# Patient Record
Sex: Male | Born: 1945 | Race: White | Hispanic: No | Marital: Married | Smoking: Never smoker
Health system: Southern US, Community
[De-identification: ages and names within clinical notes are randomized; demographics above are authoritative.]

## PROBLEM LIST (undated history)

## (undated) DIAGNOSIS — G4730 Sleep apnea, unspecified: Secondary | ICD-10-CM

## (undated) DIAGNOSIS — I2510 Atherosclerotic heart disease of native coronary artery without angina pectoris: Secondary | ICD-10-CM

## (undated) DIAGNOSIS — K5790 Diverticulosis of intestine, part unspecified, without perforation or abscess without bleeding: Secondary | ICD-10-CM

## (undated) DIAGNOSIS — F419 Anxiety disorder, unspecified: Secondary | ICD-10-CM

## (undated) DIAGNOSIS — M1990 Unspecified osteoarthritis, unspecified site: Secondary | ICD-10-CM

## (undated) DIAGNOSIS — M109 Gout, unspecified: Secondary | ICD-10-CM

## (undated) DIAGNOSIS — Z9289 Personal history of other medical treatment: Secondary | ICD-10-CM

## (undated) DIAGNOSIS — G245 Blepharospasm: Secondary | ICD-10-CM

## (undated) DIAGNOSIS — E785 Hyperlipidemia, unspecified: Secondary | ICD-10-CM

## (undated) DIAGNOSIS — I10 Essential (primary) hypertension: Secondary | ICD-10-CM

## (undated) HISTORY — PX: KNEE ARTHROSCOPY: SUR90

## (undated) HISTORY — DX: Essential (primary) hypertension: I10

## (undated) HISTORY — DX: Anxiety disorder, unspecified: F41.9

## (undated) HISTORY — PX: SHOULDER ARTHROSCOPY W/ ROTATOR CUFF REPAIR: SHX2400

## (undated) HISTORY — DX: Hyperlipidemia, unspecified: E78.5

## (undated) HISTORY — PX: BACK SURGERY: SHX140

## (undated) HISTORY — PX: CARDIAC CATHETERIZATION: SHX172

## (undated) HISTORY — PX: BROW LIFT AND BLEPHAROPLASTY: SHX1271

## (undated) HISTORY — DX: Atherosclerotic heart disease of native coronary artery without angina pectoris: I25.10

## (undated) HISTORY — DX: Personal history of other medical treatment: Z92.89

## (undated) HISTORY — DX: Gout, unspecified: M10.9

---

## 2000-04-22 ENCOUNTER — Ambulatory Visit (HOSPITAL_COMMUNITY): Admission: RE | Admit: 2000-04-22 | Discharge: 2000-04-22 | Payer: Self-pay | Admitting: Gastroenterology

## 2000-04-22 ENCOUNTER — Encounter: Payer: Self-pay | Admitting: Gastroenterology

## 2002-07-26 ENCOUNTER — Encounter: Payer: Self-pay | Admitting: *Deleted

## 2002-07-26 ENCOUNTER — Encounter: Admission: RE | Admit: 2002-07-26 | Discharge: 2002-07-26 | Payer: Self-pay | Admitting: *Deleted

## 2002-08-31 ENCOUNTER — Ambulatory Visit (HOSPITAL_COMMUNITY): Admission: RE | Admit: 2002-08-31 | Discharge: 2002-08-31 | Payer: Self-pay | Admitting: Gastroenterology

## 2002-11-01 ENCOUNTER — Encounter: Admission: RE | Admit: 2002-11-01 | Discharge: 2002-11-01 | Payer: Self-pay | Admitting: Internal Medicine

## 2002-11-01 ENCOUNTER — Encounter: Payer: Self-pay | Admitting: Internal Medicine

## 2002-12-06 ENCOUNTER — Encounter: Payer: Self-pay | Admitting: Internal Medicine

## 2002-12-06 ENCOUNTER — Encounter: Admission: RE | Admit: 2002-12-06 | Discharge: 2002-12-06 | Payer: Self-pay | Admitting: Internal Medicine

## 2002-12-23 ENCOUNTER — Encounter: Admission: RE | Admit: 2002-12-23 | Discharge: 2002-12-23 | Payer: Self-pay | Admitting: Internal Medicine

## 2002-12-23 ENCOUNTER — Encounter: Payer: Self-pay | Admitting: Internal Medicine

## 2008-05-15 ENCOUNTER — Encounter: Payer: Self-pay | Admitting: Interventional Cardiology

## 2008-05-23 ENCOUNTER — Encounter: Admission: RE | Admit: 2008-05-23 | Discharge: 2008-05-23 | Payer: Self-pay | Admitting: Interventional Cardiology

## 2008-05-29 ENCOUNTER — Inpatient Hospital Stay (HOSPITAL_BASED_OUTPATIENT_CLINIC_OR_DEPARTMENT_OTHER): Admission: RE | Admit: 2008-05-29 | Discharge: 2008-05-29 | Payer: Self-pay | Admitting: Interventional Cardiology

## 2008-12-25 ENCOUNTER — Encounter: Admission: RE | Admit: 2008-12-25 | Discharge: 2008-12-25 | Payer: Self-pay | Admitting: Gastroenterology

## 2009-03-19 ENCOUNTER — Encounter: Admission: RE | Admit: 2009-03-19 | Discharge: 2009-03-19 | Payer: Self-pay | Admitting: Family Medicine

## 2009-06-05 ENCOUNTER — Encounter: Admission: RE | Admit: 2009-06-05 | Discharge: 2009-06-05 | Payer: Self-pay | Admitting: Neurological Surgery

## 2009-09-08 ENCOUNTER — Encounter: Admission: RE | Admit: 2009-09-08 | Discharge: 2009-09-08 | Payer: Self-pay | Admitting: Orthopedic Surgery

## 2010-11-19 NOTE — Cardiovascular Report (Signed)
NAME:  John Novak, John Novak NO.:  [id number]   MEDICAL RECORD NO.:  [id number]          PATIENT TYPE:  OIB   LOCATION:  1961                         FACILITY:  [facility]   PHYSICIAN:  Lyn Records, M.D.   DATE OF BIRTH:  1946/03/09   DATE OF PROCEDURE:  05/29/2008  DATE OF DISCHARGE:                            CARDIAC CATHETERIZATION   INDICATIONS:  [name] has risk factors for coronary disease.  He has  had atypical chest discomfort.  He recently underwent a Cardiolite study  that demonstrated possible lateral wall ischemia and fixed inferior wall  defect.  This study is being done to define coronary anatomy and help  guide therapy.   PROCEDURES PERFORMED:  1. Left heart catheterization.  2. Selective coronary angiogram.  3. Left ventriculography.   DESCRIPTION OF PROCEDURE:  A 4-French sheath was inserted into the right  femoral artery using a modified Seldinger technique.  A 4-French A2  multipurpose catheter was used for hemodynamic recordings, left  ventriculography by hand injection, and right coronary angiography.  A  #4 left Judkins catheter was used for left coronary angiography.   The patient received 1% Xylocaine infiltration for local anesthesia and  was given mild sedation with Versed and fentanyl 1 mg and 25 mcg  respectively prior to administering Xylocaine.  The patient tolerated  the procedure without complications and hemostasis was achieved with  manual compression.   RESULTS:  1. Hemodynamic data:      a.     Aortic pressure 150/83 mmHg.      b.     Left ventricular pressure 156/25 mmHg.  2. Left ventriculography:  The left ventricular cavity size and      function are normal.  The EF is 60%.  3. Coronary angiography:      a.     Left main coronary:  Left main is calcified.  There is 30%       distal tubular narrowing within the calcified segment.      b.     Left anterior descending coronary:  The LAD is large.  It       contains proximal  calcification.  It is widely patent.       Irregularities with up to 30% narrowing are noted.  LAD wraps       around the left ventricular apex.  Irregularities are noted in the       midvessel.      c.     Circumflex artery:  The circumflex coronary artery is       calcified.  Gives origin to a large first obtuse marginal and a       large second obtuse marginal.  Irregularities with up to 30-40%       narrowing are noted.  No high-grade obstruction is seen.      d.     Right coronary:  This vessel is also heavily calcified.  It       gives origin to a small PDA and a larger left ventricular branch.       A smaller second left  ventricular branch was also noted just off       the atrioventricular groove on the mid lateral wall.  The proximal       and mid calcification also represents 20-30% narrowing within the       proximal and mid right coronary.   CONCLUSIONS:  1. Atherosclerosis with nonobstructive coronaries.  There is      calcification and plaque involving the distal left main, the      proximal and mid left anterior descending, the mid circumflex, and      the proximal and mid right coronary artery.  No significant      obstructions are noted.  The vessels are widely patent.  2. Normal left ventricular function.  3. False positive nuclear study.   PLAN:  Aggressive risk factor modification and antiplatelet therapy  going forward.  I would treat the patient aggressively to get his LDL  cholesterol less than 70.      Lyn Records, M.D.  Electronically Signed     HWS/MEDQ  D:  05/29/2008  T:  05/29/2008  Job:  [id number]   cc:   Pam Drown, M.D.

## 2011-07-09 DIAGNOSIS — M5137 Other intervertebral disc degeneration, lumbosacral region: Secondary | ICD-10-CM | POA: Diagnosis not present

## 2011-07-09 DIAGNOSIS — M5030 Other cervical disc degeneration, unspecified cervical region: Secondary | ICD-10-CM | POA: Diagnosis not present

## 2011-07-09 DIAGNOSIS — M9981 Other biomechanical lesions of cervical region: Secondary | ICD-10-CM | POA: Diagnosis not present

## 2011-07-09 DIAGNOSIS — M999 Biomechanical lesion, unspecified: Secondary | ICD-10-CM | POA: Diagnosis not present

## 2011-07-10 DIAGNOSIS — J309 Allergic rhinitis, unspecified: Secondary | ICD-10-CM | POA: Diagnosis not present

## 2011-07-15 DIAGNOSIS — J309 Allergic rhinitis, unspecified: Secondary | ICD-10-CM | POA: Diagnosis not present

## 2011-07-15 DIAGNOSIS — M999 Biomechanical lesion, unspecified: Secondary | ICD-10-CM | POA: Diagnosis not present

## 2011-07-15 DIAGNOSIS — M9981 Other biomechanical lesions of cervical region: Secondary | ICD-10-CM | POA: Diagnosis not present

## 2011-07-15 DIAGNOSIS — M5030 Other cervical disc degeneration, unspecified cervical region: Secondary | ICD-10-CM | POA: Diagnosis not present

## 2011-07-15 DIAGNOSIS — M5137 Other intervertebral disc degeneration, lumbosacral region: Secondary | ICD-10-CM | POA: Diagnosis not present

## 2011-07-22 DIAGNOSIS — M5030 Other cervical disc degeneration, unspecified cervical region: Secondary | ICD-10-CM | POA: Diagnosis not present

## 2011-07-22 DIAGNOSIS — M5137 Other intervertebral disc degeneration, lumbosacral region: Secondary | ICD-10-CM | POA: Diagnosis not present

## 2011-07-22 DIAGNOSIS — M999 Biomechanical lesion, unspecified: Secondary | ICD-10-CM | POA: Diagnosis not present

## 2011-07-22 DIAGNOSIS — J309 Allergic rhinitis, unspecified: Secondary | ICD-10-CM | POA: Diagnosis not present

## 2011-07-22 DIAGNOSIS — M9981 Other biomechanical lesions of cervical region: Secondary | ICD-10-CM | POA: Diagnosis not present

## 2011-07-23 ENCOUNTER — Other Ambulatory Visit: Payer: Self-pay | Admitting: Gastroenterology

## 2011-07-23 DIAGNOSIS — R143 Flatulence: Secondary | ICD-10-CM | POA: Diagnosis not present

## 2011-07-23 DIAGNOSIS — K219 Gastro-esophageal reflux disease without esophagitis: Secondary | ICD-10-CM | POA: Diagnosis not present

## 2011-07-23 DIAGNOSIS — R1011 Right upper quadrant pain: Secondary | ICD-10-CM | POA: Diagnosis not present

## 2011-07-24 ENCOUNTER — Other Ambulatory Visit: Payer: Self-pay

## 2011-07-24 DIAGNOSIS — G518 Other disorders of facial nerve: Secondary | ICD-10-CM | POA: Diagnosis not present

## 2011-07-24 DIAGNOSIS — Z09 Encounter for follow-up examination after completed treatment for conditions other than malignant neoplasm: Secondary | ICD-10-CM | POA: Diagnosis not present

## 2011-07-24 DIAGNOSIS — G243 Spasmodic torticollis: Secondary | ICD-10-CM | POA: Diagnosis not present

## 2011-07-25 ENCOUNTER — Ambulatory Visit
Admission: RE | Admit: 2011-07-25 | Discharge: 2011-07-25 | Disposition: A | Discharge status: Home / Self Care | Payer: Medicare Other | Source: Ambulatory Visit | Attending: Gastroenterology | Admitting: Gastroenterology

## 2011-07-25 DIAGNOSIS — R110 Nausea: Secondary | ICD-10-CM | POA: Diagnosis not present

## 2011-07-25 DIAGNOSIS — I2510 Atherosclerotic heart disease of native coronary artery without angina pectoris: Secondary | ICD-10-CM | POA: Diagnosis not present

## 2011-07-25 DIAGNOSIS — R109 Unspecified abdominal pain: Secondary | ICD-10-CM | POA: Diagnosis not present

## 2011-07-25 MED ORDER — IOHEXOL 300 MG/ML  SOLN
125.0000 mL | Freq: Once | INTRAMUSCULAR | Status: AC | PRN
  Administered 2011-07-25: 125 mL via INTRAVENOUS

## 2011-07-30 ENCOUNTER — Other Ambulatory Visit (HOSPITAL_COMMUNITY): Payer: Self-pay | Admitting: Gastroenterology

## 2011-07-30 DIAGNOSIS — R140 Abdominal distension (gaseous): Secondary | ICD-10-CM

## 2011-07-31 DIAGNOSIS — J309 Allergic rhinitis, unspecified: Secondary | ICD-10-CM | POA: Diagnosis not present

## 2011-08-01 DIAGNOSIS — M5030 Other cervical disc degeneration, unspecified cervical region: Secondary | ICD-10-CM | POA: Diagnosis not present

## 2011-08-01 DIAGNOSIS — M5137 Other intervertebral disc degeneration, lumbosacral region: Secondary | ICD-10-CM | POA: Diagnosis not present

## 2011-08-01 DIAGNOSIS — M9981 Other biomechanical lesions of cervical region: Secondary | ICD-10-CM | POA: Diagnosis not present

## 2011-08-01 DIAGNOSIS — M999 Biomechanical lesion, unspecified: Secondary | ICD-10-CM | POA: Diagnosis not present

## 2011-08-04 DIAGNOSIS — G245 Blepharospasm: Secondary | ICD-10-CM | POA: Diagnosis not present

## 2011-08-05 DIAGNOSIS — M999 Biomechanical lesion, unspecified: Secondary | ICD-10-CM | POA: Diagnosis not present

## 2011-08-05 DIAGNOSIS — M5030 Other cervical disc degeneration, unspecified cervical region: Secondary | ICD-10-CM | POA: Diagnosis not present

## 2011-08-05 DIAGNOSIS — J309 Allergic rhinitis, unspecified: Secondary | ICD-10-CM | POA: Diagnosis not present

## 2011-08-05 DIAGNOSIS — M9981 Other biomechanical lesions of cervical region: Secondary | ICD-10-CM | POA: Diagnosis not present

## 2011-08-05 DIAGNOSIS — M5137 Other intervertebral disc degeneration, lumbosacral region: Secondary | ICD-10-CM | POA: Diagnosis not present

## 2011-08-08 ENCOUNTER — Encounter (HOSPITAL_COMMUNITY)
Admission: RE | Admit: 2011-08-08 | Discharge: 2011-08-08 | Disposition: A | Discharge status: Home / Self Care | Payer: Medicare Other | Source: Ambulatory Visit | Attending: Gastroenterology | Admitting: Gastroenterology

## 2011-08-08 DIAGNOSIS — R112 Nausea with vomiting, unspecified: Secondary | ICD-10-CM | POA: Diagnosis not present

## 2011-08-08 DIAGNOSIS — R109 Unspecified abdominal pain: Secondary | ICD-10-CM | POA: Diagnosis not present

## 2011-08-08 DIAGNOSIS — R1013 Epigastric pain: Secondary | ICD-10-CM | POA: Diagnosis not present

## 2011-08-08 DIAGNOSIS — K3189 Other diseases of stomach and duodenum: Secondary | ICD-10-CM | POA: Diagnosis not present

## 2011-08-08 DIAGNOSIS — R143 Flatulence: Secondary | ICD-10-CM | POA: Insufficient documentation

## 2011-08-08 DIAGNOSIS — R141 Gas pain: Secondary | ICD-10-CM | POA: Diagnosis not present

## 2011-08-08 DIAGNOSIS — R140 Abdominal distension (gaseous): Secondary | ICD-10-CM

## 2011-08-08 DIAGNOSIS — R142 Eructation: Secondary | ICD-10-CM | POA: Insufficient documentation

## 2011-08-08 MED ORDER — TECHNETIUM TC 99M SULFUR COLLOID
1.9000 | Freq: Once | INTRAVENOUS | Status: AC | PRN
  Administered 2011-08-08: 1.9 via ORAL

## 2011-08-12 DIAGNOSIS — K3184 Gastroparesis: Secondary | ICD-10-CM | POA: Diagnosis not present

## 2011-08-12 DIAGNOSIS — Z86010 Personal history of colonic polyps: Secondary | ICD-10-CM | POA: Diagnosis not present

## 2011-08-12 DIAGNOSIS — M5137 Other intervertebral disc degeneration, lumbosacral region: Secondary | ICD-10-CM | POA: Diagnosis not present

## 2011-08-12 DIAGNOSIS — M5030 Other cervical disc degeneration, unspecified cervical region: Secondary | ICD-10-CM | POA: Diagnosis not present

## 2011-08-12 DIAGNOSIS — M9981 Other biomechanical lesions of cervical region: Secondary | ICD-10-CM | POA: Diagnosis not present

## 2011-08-12 DIAGNOSIS — J309 Allergic rhinitis, unspecified: Secondary | ICD-10-CM | POA: Diagnosis not present

## 2011-08-12 DIAGNOSIS — M999 Biomechanical lesion, unspecified: Secondary | ICD-10-CM | POA: Diagnosis not present

## 2011-08-19 DIAGNOSIS — J309 Allergic rhinitis, unspecified: Secondary | ICD-10-CM | POA: Diagnosis not present

## 2011-08-19 DIAGNOSIS — M5030 Other cervical disc degeneration, unspecified cervical region: Secondary | ICD-10-CM | POA: Diagnosis not present

## 2011-08-19 DIAGNOSIS — M9981 Other biomechanical lesions of cervical region: Secondary | ICD-10-CM | POA: Diagnosis not present

## 2011-08-19 DIAGNOSIS — M5137 Other intervertebral disc degeneration, lumbosacral region: Secondary | ICD-10-CM | POA: Diagnosis not present

## 2011-08-19 DIAGNOSIS — M999 Biomechanical lesion, unspecified: Secondary | ICD-10-CM | POA: Diagnosis not present

## 2011-08-26 DIAGNOSIS — M999 Biomechanical lesion, unspecified: Secondary | ICD-10-CM | POA: Diagnosis not present

## 2011-08-26 DIAGNOSIS — M9981 Other biomechanical lesions of cervical region: Secondary | ICD-10-CM | POA: Diagnosis not present

## 2011-08-26 DIAGNOSIS — M5137 Other intervertebral disc degeneration, lumbosacral region: Secondary | ICD-10-CM | POA: Diagnosis not present

## 2011-08-26 DIAGNOSIS — J309 Allergic rhinitis, unspecified: Secondary | ICD-10-CM | POA: Diagnosis not present

## 2011-08-26 DIAGNOSIS — M5030 Other cervical disc degeneration, unspecified cervical region: Secondary | ICD-10-CM | POA: Diagnosis not present

## 2011-08-27 DIAGNOSIS — I2510 Atherosclerotic heart disease of native coronary artery without angina pectoris: Secondary | ICD-10-CM | POA: Diagnosis not present

## 2011-08-27 DIAGNOSIS — M109 Gout, unspecified: Secondary | ICD-10-CM | POA: Diagnosis not present

## 2011-08-27 DIAGNOSIS — I10 Essential (primary) hypertension: Secondary | ICD-10-CM | POA: Diagnosis not present

## 2011-08-27 DIAGNOSIS — Z791 Long term (current) use of non-steroidal anti-inflammatories (NSAID): Secondary | ICD-10-CM | POA: Diagnosis not present

## 2011-08-27 DIAGNOSIS — G479 Sleep disorder, unspecified: Secondary | ICD-10-CM | POA: Diagnosis not present

## 2011-08-27 DIAGNOSIS — K219 Gastro-esophageal reflux disease without esophagitis: Secondary | ICD-10-CM | POA: Diagnosis not present

## 2011-08-27 DIAGNOSIS — E782 Mixed hyperlipidemia: Secondary | ICD-10-CM | POA: Diagnosis not present

## 2011-08-27 DIAGNOSIS — G244 Idiopathic orofacial dystonia: Secondary | ICD-10-CM | POA: Diagnosis not present

## 2011-09-02 DIAGNOSIS — M5030 Other cervical disc degeneration, unspecified cervical region: Secondary | ICD-10-CM | POA: Diagnosis not present

## 2011-09-02 DIAGNOSIS — J309 Allergic rhinitis, unspecified: Secondary | ICD-10-CM | POA: Diagnosis not present

## 2011-09-02 DIAGNOSIS — M5137 Other intervertebral disc degeneration, lumbosacral region: Secondary | ICD-10-CM | POA: Diagnosis not present

## 2011-09-02 DIAGNOSIS — M999 Biomechanical lesion, unspecified: Secondary | ICD-10-CM | POA: Diagnosis not present

## 2011-09-02 DIAGNOSIS — M9981 Other biomechanical lesions of cervical region: Secondary | ICD-10-CM | POA: Diagnosis not present

## 2011-09-09 DIAGNOSIS — M5137 Other intervertebral disc degeneration, lumbosacral region: Secondary | ICD-10-CM | POA: Diagnosis not present

## 2011-09-09 DIAGNOSIS — M999 Biomechanical lesion, unspecified: Secondary | ICD-10-CM | POA: Diagnosis not present

## 2011-09-09 DIAGNOSIS — M9981 Other biomechanical lesions of cervical region: Secondary | ICD-10-CM | POA: Diagnosis not present

## 2011-09-09 DIAGNOSIS — J309 Allergic rhinitis, unspecified: Secondary | ICD-10-CM | POA: Diagnosis not present

## 2011-09-09 DIAGNOSIS — M5030 Other cervical disc degeneration, unspecified cervical region: Secondary | ICD-10-CM | POA: Diagnosis not present

## 2011-09-16 DIAGNOSIS — M9981 Other biomechanical lesions of cervical region: Secondary | ICD-10-CM | POA: Diagnosis not present

## 2011-09-16 DIAGNOSIS — M5137 Other intervertebral disc degeneration, lumbosacral region: Secondary | ICD-10-CM | POA: Diagnosis not present

## 2011-09-16 DIAGNOSIS — M999 Biomechanical lesion, unspecified: Secondary | ICD-10-CM | POA: Diagnosis not present

## 2011-09-16 DIAGNOSIS — J309 Allergic rhinitis, unspecified: Secondary | ICD-10-CM | POA: Diagnosis not present

## 2011-09-16 DIAGNOSIS — M5030 Other cervical disc degeneration, unspecified cervical region: Secondary | ICD-10-CM | POA: Diagnosis not present

## 2011-09-23 DIAGNOSIS — M999 Biomechanical lesion, unspecified: Secondary | ICD-10-CM | POA: Diagnosis not present

## 2011-09-23 DIAGNOSIS — M9981 Other biomechanical lesions of cervical region: Secondary | ICD-10-CM | POA: Diagnosis not present

## 2011-09-23 DIAGNOSIS — M5137 Other intervertebral disc degeneration, lumbosacral region: Secondary | ICD-10-CM | POA: Diagnosis not present

## 2011-09-23 DIAGNOSIS — M5030 Other cervical disc degeneration, unspecified cervical region: Secondary | ICD-10-CM | POA: Diagnosis not present

## 2011-09-23 DIAGNOSIS — J309 Allergic rhinitis, unspecified: Secondary | ICD-10-CM | POA: Diagnosis not present

## 2011-09-29 DIAGNOSIS — G245 Blepharospasm: Secondary | ICD-10-CM | POA: Diagnosis not present

## 2011-09-30 DIAGNOSIS — J309 Allergic rhinitis, unspecified: Secondary | ICD-10-CM | POA: Diagnosis not present

## 2011-10-07 DIAGNOSIS — J309 Allergic rhinitis, unspecified: Secondary | ICD-10-CM | POA: Diagnosis not present

## 2011-10-14 DIAGNOSIS — J309 Allergic rhinitis, unspecified: Secondary | ICD-10-CM | POA: Diagnosis not present

## 2011-10-21 DIAGNOSIS — J309 Allergic rhinitis, unspecified: Secondary | ICD-10-CM | POA: Diagnosis not present

## 2011-10-28 DIAGNOSIS — J309 Allergic rhinitis, unspecified: Secondary | ICD-10-CM | POA: Diagnosis not present

## 2011-11-04 DIAGNOSIS — J309 Allergic rhinitis, unspecified: Secondary | ICD-10-CM | POA: Diagnosis not present

## 2011-11-06 DIAGNOSIS — J309 Allergic rhinitis, unspecified: Secondary | ICD-10-CM | POA: Diagnosis not present

## 2011-11-11 DIAGNOSIS — J309 Allergic rhinitis, unspecified: Secondary | ICD-10-CM | POA: Diagnosis not present

## 2011-11-18 DIAGNOSIS — J309 Allergic rhinitis, unspecified: Secondary | ICD-10-CM | POA: Diagnosis not present

## 2011-11-19 DIAGNOSIS — I10 Essential (primary) hypertension: Secondary | ICD-10-CM | POA: Diagnosis not present

## 2011-11-19 DIAGNOSIS — I2510 Atherosclerotic heart disease of native coronary artery without angina pectoris: Secondary | ICD-10-CM | POA: Diagnosis not present

## 2011-11-25 DIAGNOSIS — J309 Allergic rhinitis, unspecified: Secondary | ICD-10-CM | POA: Diagnosis not present

## 2011-12-02 DIAGNOSIS — J309 Allergic rhinitis, unspecified: Secondary | ICD-10-CM | POA: Diagnosis not present

## 2011-12-09 DIAGNOSIS — J309 Allergic rhinitis, unspecified: Secondary | ICD-10-CM | POA: Diagnosis not present

## 2011-12-16 DIAGNOSIS — M9981 Other biomechanical lesions of cervical region: Secondary | ICD-10-CM | POA: Diagnosis not present

## 2011-12-16 DIAGNOSIS — M999 Biomechanical lesion, unspecified: Secondary | ICD-10-CM | POA: Diagnosis not present

## 2011-12-16 DIAGNOSIS — M5137 Other intervertebral disc degeneration, lumbosacral region: Secondary | ICD-10-CM | POA: Diagnosis not present

## 2011-12-16 DIAGNOSIS — J309 Allergic rhinitis, unspecified: Secondary | ICD-10-CM | POA: Diagnosis not present

## 2011-12-16 DIAGNOSIS — M5030 Other cervical disc degeneration, unspecified cervical region: Secondary | ICD-10-CM | POA: Diagnosis not present

## 2011-12-23 DIAGNOSIS — M9981 Other biomechanical lesions of cervical region: Secondary | ICD-10-CM | POA: Diagnosis not present

## 2011-12-23 DIAGNOSIS — J309 Allergic rhinitis, unspecified: Secondary | ICD-10-CM | POA: Diagnosis not present

## 2011-12-23 DIAGNOSIS — M999 Biomechanical lesion, unspecified: Secondary | ICD-10-CM | POA: Diagnosis not present

## 2011-12-23 DIAGNOSIS — M5030 Other cervical disc degeneration, unspecified cervical region: Secondary | ICD-10-CM | POA: Diagnosis not present

## 2011-12-23 DIAGNOSIS — M5137 Other intervertebral disc degeneration, lumbosacral region: Secondary | ICD-10-CM | POA: Diagnosis not present

## 2011-12-25 DIAGNOSIS — G244 Idiopathic orofacial dystonia: Secondary | ICD-10-CM | POA: Diagnosis not present

## 2011-12-29 DIAGNOSIS — G245 Blepharospasm: Secondary | ICD-10-CM | POA: Diagnosis not present

## 2011-12-30 DIAGNOSIS — M5030 Other cervical disc degeneration, unspecified cervical region: Secondary | ICD-10-CM | POA: Diagnosis not present

## 2011-12-30 DIAGNOSIS — M5137 Other intervertebral disc degeneration, lumbosacral region: Secondary | ICD-10-CM | POA: Diagnosis not present

## 2011-12-30 DIAGNOSIS — M999 Biomechanical lesion, unspecified: Secondary | ICD-10-CM | POA: Diagnosis not present

## 2011-12-30 DIAGNOSIS — M9981 Other biomechanical lesions of cervical region: Secondary | ICD-10-CM | POA: Diagnosis not present

## 2011-12-30 DIAGNOSIS — J309 Allergic rhinitis, unspecified: Secondary | ICD-10-CM | POA: Diagnosis not present

## 2012-01-06 DIAGNOSIS — M5137 Other intervertebral disc degeneration, lumbosacral region: Secondary | ICD-10-CM | POA: Diagnosis not present

## 2012-01-06 DIAGNOSIS — M999 Biomechanical lesion, unspecified: Secondary | ICD-10-CM | POA: Diagnosis not present

## 2012-01-06 DIAGNOSIS — J309 Allergic rhinitis, unspecified: Secondary | ICD-10-CM | POA: Diagnosis not present

## 2012-01-06 DIAGNOSIS — M5030 Other cervical disc degeneration, unspecified cervical region: Secondary | ICD-10-CM | POA: Diagnosis not present

## 2012-01-06 DIAGNOSIS — M9981 Other biomechanical lesions of cervical region: Secondary | ICD-10-CM | POA: Diagnosis not present

## 2012-01-13 DIAGNOSIS — M9981 Other biomechanical lesions of cervical region: Secondary | ICD-10-CM | POA: Diagnosis not present

## 2012-01-13 DIAGNOSIS — M5030 Other cervical disc degeneration, unspecified cervical region: Secondary | ICD-10-CM | POA: Diagnosis not present

## 2012-01-13 DIAGNOSIS — M5137 Other intervertebral disc degeneration, lumbosacral region: Secondary | ICD-10-CM | POA: Diagnosis not present

## 2012-01-13 DIAGNOSIS — J309 Allergic rhinitis, unspecified: Secondary | ICD-10-CM | POA: Diagnosis not present

## 2012-01-13 DIAGNOSIS — M999 Biomechanical lesion, unspecified: Secondary | ICD-10-CM | POA: Diagnosis not present

## 2012-01-20 DIAGNOSIS — J301 Allergic rhinitis due to pollen: Secondary | ICD-10-CM | POA: Diagnosis not present

## 2012-01-20 DIAGNOSIS — M5030 Other cervical disc degeneration, unspecified cervical region: Secondary | ICD-10-CM | POA: Diagnosis not present

## 2012-01-20 DIAGNOSIS — M5137 Other intervertebral disc degeneration, lumbosacral region: Secondary | ICD-10-CM | POA: Diagnosis not present

## 2012-01-20 DIAGNOSIS — J3089 Other allergic rhinitis: Secondary | ICD-10-CM | POA: Diagnosis not present

## 2012-01-20 DIAGNOSIS — J309 Allergic rhinitis, unspecified: Secondary | ICD-10-CM | POA: Diagnosis not present

## 2012-01-20 DIAGNOSIS — H1045 Other chronic allergic conjunctivitis: Secondary | ICD-10-CM | POA: Diagnosis not present

## 2012-01-20 DIAGNOSIS — M999 Biomechanical lesion, unspecified: Secondary | ICD-10-CM | POA: Diagnosis not present

## 2012-01-20 DIAGNOSIS — M9981 Other biomechanical lesions of cervical region: Secondary | ICD-10-CM | POA: Diagnosis not present

## 2012-01-27 DIAGNOSIS — J309 Allergic rhinitis, unspecified: Secondary | ICD-10-CM | POA: Diagnosis not present

## 2012-02-03 DIAGNOSIS — J309 Allergic rhinitis, unspecified: Secondary | ICD-10-CM | POA: Diagnosis not present

## 2012-02-10 DIAGNOSIS — M999 Biomechanical lesion, unspecified: Secondary | ICD-10-CM | POA: Diagnosis not present

## 2012-02-10 DIAGNOSIS — J309 Allergic rhinitis, unspecified: Secondary | ICD-10-CM | POA: Diagnosis not present

## 2012-02-10 DIAGNOSIS — M9981 Other biomechanical lesions of cervical region: Secondary | ICD-10-CM | POA: Diagnosis not present

## 2012-02-10 DIAGNOSIS — M5030 Other cervical disc degeneration, unspecified cervical region: Secondary | ICD-10-CM | POA: Diagnosis not present

## 2012-02-10 DIAGNOSIS — M5137 Other intervertebral disc degeneration, lumbosacral region: Secondary | ICD-10-CM | POA: Diagnosis not present

## 2012-02-24 DIAGNOSIS — J309 Allergic rhinitis, unspecified: Secondary | ICD-10-CM | POA: Diagnosis not present

## 2012-02-27 DIAGNOSIS — H2510 Age-related nuclear cataract, unspecified eye: Secondary | ICD-10-CM | POA: Diagnosis not present

## 2012-02-27 DIAGNOSIS — H04129 Dry eye syndrome of unspecified lacrimal gland: Secondary | ICD-10-CM | POA: Diagnosis not present

## 2012-02-27 DIAGNOSIS — H25019 Cortical age-related cataract, unspecified eye: Secondary | ICD-10-CM | POA: Diagnosis not present

## 2012-03-01 DIAGNOSIS — K219 Gastro-esophageal reflux disease without esophagitis: Secondary | ICD-10-CM | POA: Diagnosis not present

## 2012-03-01 DIAGNOSIS — G244 Idiopathic orofacial dystonia: Secondary | ICD-10-CM | POA: Diagnosis not present

## 2012-03-01 DIAGNOSIS — G479 Sleep disorder, unspecified: Secondary | ICD-10-CM | POA: Diagnosis not present

## 2012-03-01 DIAGNOSIS — I2510 Atherosclerotic heart disease of native coronary artery without angina pectoris: Secondary | ICD-10-CM | POA: Diagnosis not present

## 2012-03-01 DIAGNOSIS — I10 Essential (primary) hypertension: Secondary | ICD-10-CM | POA: Diagnosis not present

## 2012-03-01 DIAGNOSIS — IMO0002 Reserved for concepts with insufficient information to code with codable children: Secondary | ICD-10-CM | POA: Diagnosis not present

## 2012-03-01 DIAGNOSIS — E782 Mixed hyperlipidemia: Secondary | ICD-10-CM | POA: Diagnosis not present

## 2012-03-01 DIAGNOSIS — M109 Gout, unspecified: Secondary | ICD-10-CM | POA: Diagnosis not present

## 2012-03-02 DIAGNOSIS — J309 Allergic rhinitis, unspecified: Secondary | ICD-10-CM | POA: Diagnosis not present

## 2012-03-03 DIAGNOSIS — E782 Mixed hyperlipidemia: Secondary | ICD-10-CM | POA: Diagnosis not present

## 2012-03-03 DIAGNOSIS — Z86010 Personal history of colonic polyps: Secondary | ICD-10-CM | POA: Diagnosis not present

## 2012-03-03 DIAGNOSIS — K219 Gastro-esophageal reflux disease without esophagitis: Secondary | ICD-10-CM | POA: Diagnosis not present

## 2012-03-03 DIAGNOSIS — M109 Gout, unspecified: Secondary | ICD-10-CM | POA: Diagnosis not present

## 2012-03-03 DIAGNOSIS — G244 Idiopathic orofacial dystonia: Secondary | ICD-10-CM | POA: Diagnosis not present

## 2012-03-03 DIAGNOSIS — G479 Sleep disorder, unspecified: Secondary | ICD-10-CM | POA: Diagnosis not present

## 2012-03-03 DIAGNOSIS — K3184 Gastroparesis: Secondary | ICD-10-CM | POA: Diagnosis not present

## 2012-03-03 DIAGNOSIS — I10 Essential (primary) hypertension: Secondary | ICD-10-CM | POA: Diagnosis not present

## 2012-03-09 DIAGNOSIS — J309 Allergic rhinitis, unspecified: Secondary | ICD-10-CM | POA: Diagnosis not present

## 2012-03-16 DIAGNOSIS — J309 Allergic rhinitis, unspecified: Secondary | ICD-10-CM | POA: Diagnosis not present

## 2012-03-23 DIAGNOSIS — J309 Allergic rhinitis, unspecified: Secondary | ICD-10-CM | POA: Diagnosis not present

## 2012-03-30 DIAGNOSIS — G245 Blepharospasm: Secondary | ICD-10-CM | POA: Diagnosis not present

## 2012-04-02 DIAGNOSIS — J309 Allergic rhinitis, unspecified: Secondary | ICD-10-CM | POA: Diagnosis not present

## 2012-04-06 DIAGNOSIS — J309 Allergic rhinitis, unspecified: Secondary | ICD-10-CM | POA: Diagnosis not present

## 2012-04-13 DIAGNOSIS — J309 Allergic rhinitis, unspecified: Secondary | ICD-10-CM | POA: Diagnosis not present

## 2012-04-18 DIAGNOSIS — Z23 Encounter for immunization: Secondary | ICD-10-CM | POA: Diagnosis not present

## 2012-04-20 DIAGNOSIS — J309 Allergic rhinitis, unspecified: Secondary | ICD-10-CM | POA: Diagnosis not present

## 2012-04-27 DIAGNOSIS — J309 Allergic rhinitis, unspecified: Secondary | ICD-10-CM | POA: Diagnosis not present

## 2012-05-04 DIAGNOSIS — J309 Allergic rhinitis, unspecified: Secondary | ICD-10-CM | POA: Diagnosis not present

## 2012-05-11 DIAGNOSIS — J309 Allergic rhinitis, unspecified: Secondary | ICD-10-CM | POA: Diagnosis not present

## 2012-05-17 DIAGNOSIS — G4710 Hypersomnia, unspecified: Secondary | ICD-10-CM | POA: Diagnosis not present

## 2012-05-17 DIAGNOSIS — R0609 Other forms of dyspnea: Secondary | ICD-10-CM | POA: Diagnosis not present

## 2012-05-18 DIAGNOSIS — J309 Allergic rhinitis, unspecified: Secondary | ICD-10-CM | POA: Diagnosis not present

## 2012-05-25 DIAGNOSIS — J309 Allergic rhinitis, unspecified: Secondary | ICD-10-CM | POA: Diagnosis not present

## 2012-05-27 DIAGNOSIS — G4730 Sleep apnea, unspecified: Secondary | ICD-10-CM | POA: Diagnosis not present

## 2012-05-27 DIAGNOSIS — G4710 Hypersomnia, unspecified: Secondary | ICD-10-CM | POA: Diagnosis not present

## 2012-05-27 DIAGNOSIS — G4733 Obstructive sleep apnea (adult) (pediatric): Secondary | ICD-10-CM | POA: Diagnosis not present

## 2012-05-27 DIAGNOSIS — G4700 Insomnia, unspecified: Secondary | ICD-10-CM | POA: Diagnosis not present

## 2012-06-01 DIAGNOSIS — J309 Allergic rhinitis, unspecified: Secondary | ICD-10-CM | POA: Diagnosis not present

## 2012-06-08 DIAGNOSIS — J309 Allergic rhinitis, unspecified: Secondary | ICD-10-CM | POA: Diagnosis not present

## 2012-06-15 DIAGNOSIS — G245 Blepharospasm: Secondary | ICD-10-CM | POA: Diagnosis not present

## 2012-06-18 DIAGNOSIS — J309 Allergic rhinitis, unspecified: Secondary | ICD-10-CM | POA: Diagnosis not present

## 2012-06-24 DIAGNOSIS — G243 Spasmodic torticollis: Secondary | ICD-10-CM | POA: Diagnosis not present

## 2012-07-01 DIAGNOSIS — J309 Allergic rhinitis, unspecified: Secondary | ICD-10-CM | POA: Diagnosis not present

## 2012-07-05 DIAGNOSIS — J309 Allergic rhinitis, unspecified: Secondary | ICD-10-CM | POA: Diagnosis not present

## 2012-07-06 DIAGNOSIS — J309 Allergic rhinitis, unspecified: Secondary | ICD-10-CM | POA: Diagnosis not present

## 2012-07-13 DIAGNOSIS — J309 Allergic rhinitis, unspecified: Secondary | ICD-10-CM | POA: Diagnosis not present

## 2012-07-20 DIAGNOSIS — M546 Pain in thoracic spine: Secondary | ICD-10-CM | POA: Diagnosis not present

## 2012-07-20 DIAGNOSIS — M999 Biomechanical lesion, unspecified: Secondary | ICD-10-CM | POA: Diagnosis not present

## 2012-07-20 DIAGNOSIS — M9981 Other biomechanical lesions of cervical region: Secondary | ICD-10-CM | POA: Diagnosis not present

## 2012-07-20 DIAGNOSIS — M5126 Other intervertebral disc displacement, lumbar region: Secondary | ICD-10-CM | POA: Diagnosis not present

## 2012-07-20 DIAGNOSIS — M542 Cervicalgia: Secondary | ICD-10-CM | POA: Diagnosis not present

## 2012-07-27 DIAGNOSIS — M5126 Other intervertebral disc displacement, lumbar region: Secondary | ICD-10-CM | POA: Diagnosis not present

## 2012-07-27 DIAGNOSIS — M999 Biomechanical lesion, unspecified: Secondary | ICD-10-CM | POA: Diagnosis not present

## 2012-07-27 DIAGNOSIS — M542 Cervicalgia: Secondary | ICD-10-CM | POA: Diagnosis not present

## 2012-07-27 DIAGNOSIS — J309 Allergic rhinitis, unspecified: Secondary | ICD-10-CM | POA: Diagnosis not present

## 2012-07-27 DIAGNOSIS — M9981 Other biomechanical lesions of cervical region: Secondary | ICD-10-CM | POA: Diagnosis not present

## 2012-07-27 DIAGNOSIS — M546 Pain in thoracic spine: Secondary | ICD-10-CM | POA: Diagnosis not present

## 2012-07-30 DIAGNOSIS — J310 Chronic rhinitis: Secondary | ICD-10-CM | POA: Diagnosis not present

## 2012-07-30 DIAGNOSIS — R05 Cough: Secondary | ICD-10-CM | POA: Diagnosis not present

## 2012-07-30 DIAGNOSIS — J029 Acute pharyngitis, unspecified: Secondary | ICD-10-CM | POA: Diagnosis not present

## 2012-07-30 DIAGNOSIS — R51 Headache: Secondary | ICD-10-CM | POA: Diagnosis not present

## 2012-07-30 DIAGNOSIS — J209 Acute bronchitis, unspecified: Secondary | ICD-10-CM | POA: Diagnosis not present

## 2012-08-10 DIAGNOSIS — M542 Cervicalgia: Secondary | ICD-10-CM | POA: Diagnosis not present

## 2012-08-10 DIAGNOSIS — M5126 Other intervertebral disc displacement, lumbar region: Secondary | ICD-10-CM | POA: Diagnosis not present

## 2012-08-10 DIAGNOSIS — M999 Biomechanical lesion, unspecified: Secondary | ICD-10-CM | POA: Diagnosis not present

## 2012-08-10 DIAGNOSIS — M9981 Other biomechanical lesions of cervical region: Secondary | ICD-10-CM | POA: Diagnosis not present

## 2012-08-10 DIAGNOSIS — M546 Pain in thoracic spine: Secondary | ICD-10-CM | POA: Diagnosis not present

## 2012-08-14 DIAGNOSIS — R05 Cough: Secondary | ICD-10-CM | POA: Diagnosis not present

## 2012-08-14 DIAGNOSIS — J019 Acute sinusitis, unspecified: Secondary | ICD-10-CM | POA: Diagnosis not present

## 2012-08-24 DIAGNOSIS — M5126 Other intervertebral disc displacement, lumbar region: Secondary | ICD-10-CM | POA: Diagnosis not present

## 2012-08-24 DIAGNOSIS — M9981 Other biomechanical lesions of cervical region: Secondary | ICD-10-CM | POA: Diagnosis not present

## 2012-08-24 DIAGNOSIS — M546 Pain in thoracic spine: Secondary | ICD-10-CM | POA: Diagnosis not present

## 2012-08-24 DIAGNOSIS — J309 Allergic rhinitis, unspecified: Secondary | ICD-10-CM | POA: Diagnosis not present

## 2012-08-24 DIAGNOSIS — M999 Biomechanical lesion, unspecified: Secondary | ICD-10-CM | POA: Diagnosis not present

## 2012-08-24 DIAGNOSIS — M542 Cervicalgia: Secondary | ICD-10-CM | POA: Diagnosis not present

## 2012-09-01 DIAGNOSIS — G4733 Obstructive sleep apnea (adult) (pediatric): Secondary | ICD-10-CM | POA: Diagnosis not present

## 2012-09-03 DIAGNOSIS — E782 Mixed hyperlipidemia: Secondary | ICD-10-CM | POA: Diagnosis not present

## 2012-09-17 DIAGNOSIS — J069 Acute upper respiratory infection, unspecified: Secondary | ICD-10-CM | POA: Diagnosis not present

## 2012-09-17 DIAGNOSIS — J301 Allergic rhinitis due to pollen: Secondary | ICD-10-CM | POA: Diagnosis not present

## 2012-09-24 DIAGNOSIS — J309 Allergic rhinitis, unspecified: Secondary | ICD-10-CM | POA: Diagnosis not present

## 2012-09-28 DIAGNOSIS — J309 Allergic rhinitis, unspecified: Secondary | ICD-10-CM | POA: Diagnosis not present

## 2012-10-05 DIAGNOSIS — J309 Allergic rhinitis, unspecified: Secondary | ICD-10-CM | POA: Diagnosis not present

## 2012-10-07 DIAGNOSIS — Z1211 Encounter for screening for malignant neoplasm of colon: Secondary | ICD-10-CM | POA: Diagnosis not present

## 2012-10-07 DIAGNOSIS — K644 Residual hemorrhoidal skin tags: Secondary | ICD-10-CM | POA: Diagnosis not present

## 2012-10-07 DIAGNOSIS — K5730 Diverticulosis of large intestine without perforation or abscess without bleeding: Secondary | ICD-10-CM | POA: Diagnosis not present

## 2012-10-07 DIAGNOSIS — D126 Benign neoplasm of colon, unspecified: Secondary | ICD-10-CM | POA: Diagnosis not present

## 2012-10-12 DIAGNOSIS — J309 Allergic rhinitis, unspecified: Secondary | ICD-10-CM | POA: Diagnosis not present

## 2012-10-19 DIAGNOSIS — J309 Allergic rhinitis, unspecified: Secondary | ICD-10-CM | POA: Diagnosis not present

## 2012-10-26 DIAGNOSIS — J309 Allergic rhinitis, unspecified: Secondary | ICD-10-CM | POA: Diagnosis not present

## 2012-11-02 DIAGNOSIS — J309 Allergic rhinitis, unspecified: Secondary | ICD-10-CM | POA: Diagnosis not present

## 2012-11-08 DIAGNOSIS — M5126 Other intervertebral disc displacement, lumbar region: Secondary | ICD-10-CM | POA: Diagnosis not present

## 2012-11-08 DIAGNOSIS — M542 Cervicalgia: Secondary | ICD-10-CM | POA: Diagnosis not present

## 2012-11-08 DIAGNOSIS — M9981 Other biomechanical lesions of cervical region: Secondary | ICD-10-CM | POA: Diagnosis not present

## 2012-11-08 DIAGNOSIS — M546 Pain in thoracic spine: Secondary | ICD-10-CM | POA: Diagnosis not present

## 2012-11-08 DIAGNOSIS — M999 Biomechanical lesion, unspecified: Secondary | ICD-10-CM | POA: Diagnosis not present

## 2012-11-09 DIAGNOSIS — J309 Allergic rhinitis, unspecified: Secondary | ICD-10-CM | POA: Diagnosis not present

## 2012-11-10 DIAGNOSIS — M542 Cervicalgia: Secondary | ICD-10-CM | POA: Diagnosis not present

## 2012-11-10 DIAGNOSIS — M999 Biomechanical lesion, unspecified: Secondary | ICD-10-CM | POA: Diagnosis not present

## 2012-11-10 DIAGNOSIS — M546 Pain in thoracic spine: Secondary | ICD-10-CM | POA: Diagnosis not present

## 2012-11-10 DIAGNOSIS — M9981 Other biomechanical lesions of cervical region: Secondary | ICD-10-CM | POA: Diagnosis not present

## 2012-11-10 DIAGNOSIS — M5126 Other intervertebral disc displacement, lumbar region: Secondary | ICD-10-CM | POA: Diagnosis not present

## 2012-11-12 DIAGNOSIS — M546 Pain in thoracic spine: Secondary | ICD-10-CM | POA: Diagnosis not present

## 2012-11-12 DIAGNOSIS — M542 Cervicalgia: Secondary | ICD-10-CM | POA: Diagnosis not present

## 2012-11-12 DIAGNOSIS — M9981 Other biomechanical lesions of cervical region: Secondary | ICD-10-CM | POA: Diagnosis not present

## 2012-11-12 DIAGNOSIS — M999 Biomechanical lesion, unspecified: Secondary | ICD-10-CM | POA: Diagnosis not present

## 2012-11-12 DIAGNOSIS — M5126 Other intervertebral disc displacement, lumbar region: Secondary | ICD-10-CM | POA: Diagnosis not present

## 2012-11-16 DIAGNOSIS — M546 Pain in thoracic spine: Secondary | ICD-10-CM | POA: Diagnosis not present

## 2012-11-16 DIAGNOSIS — M542 Cervicalgia: Secondary | ICD-10-CM | POA: Diagnosis not present

## 2012-11-16 DIAGNOSIS — M999 Biomechanical lesion, unspecified: Secondary | ICD-10-CM | POA: Diagnosis not present

## 2012-11-16 DIAGNOSIS — M9981 Other biomechanical lesions of cervical region: Secondary | ICD-10-CM | POA: Diagnosis not present

## 2012-11-16 DIAGNOSIS — M5126 Other intervertebral disc displacement, lumbar region: Secondary | ICD-10-CM | POA: Diagnosis not present

## 2012-11-16 DIAGNOSIS — J309 Allergic rhinitis, unspecified: Secondary | ICD-10-CM | POA: Diagnosis not present

## 2012-11-19 DIAGNOSIS — M542 Cervicalgia: Secondary | ICD-10-CM | POA: Diagnosis not present

## 2012-11-19 DIAGNOSIS — M9981 Other biomechanical lesions of cervical region: Secondary | ICD-10-CM | POA: Diagnosis not present

## 2012-11-19 DIAGNOSIS — M546 Pain in thoracic spine: Secondary | ICD-10-CM | POA: Diagnosis not present

## 2012-11-19 DIAGNOSIS — M5126 Other intervertebral disc displacement, lumbar region: Secondary | ICD-10-CM | POA: Diagnosis not present

## 2012-11-19 DIAGNOSIS — M999 Biomechanical lesion, unspecified: Secondary | ICD-10-CM | POA: Diagnosis not present

## 2012-11-23 DIAGNOSIS — J309 Allergic rhinitis, unspecified: Secondary | ICD-10-CM | POA: Diagnosis not present

## 2012-11-30 DIAGNOSIS — J309 Allergic rhinitis, unspecified: Secondary | ICD-10-CM | POA: Diagnosis not present

## 2012-11-30 DIAGNOSIS — M5126 Other intervertebral disc displacement, lumbar region: Secondary | ICD-10-CM | POA: Diagnosis not present

## 2012-11-30 DIAGNOSIS — M999 Biomechanical lesion, unspecified: Secondary | ICD-10-CM | POA: Diagnosis not present

## 2012-11-30 DIAGNOSIS — M9981 Other biomechanical lesions of cervical region: Secondary | ICD-10-CM | POA: Diagnosis not present

## 2012-11-30 DIAGNOSIS — M546 Pain in thoracic spine: Secondary | ICD-10-CM | POA: Diagnosis not present

## 2012-11-30 DIAGNOSIS — M542 Cervicalgia: Secondary | ICD-10-CM | POA: Diagnosis not present

## 2012-12-01 DIAGNOSIS — G245 Blepharospasm: Secondary | ICD-10-CM | POA: Diagnosis not present

## 2012-12-07 DIAGNOSIS — J309 Allergic rhinitis, unspecified: Secondary | ICD-10-CM | POA: Diagnosis not present

## 2012-12-08 DIAGNOSIS — M546 Pain in thoracic spine: Secondary | ICD-10-CM | POA: Diagnosis not present

## 2012-12-08 DIAGNOSIS — M5126 Other intervertebral disc displacement, lumbar region: Secondary | ICD-10-CM | POA: Diagnosis not present

## 2012-12-08 DIAGNOSIS — M9981 Other biomechanical lesions of cervical region: Secondary | ICD-10-CM | POA: Diagnosis not present

## 2012-12-08 DIAGNOSIS — M542 Cervicalgia: Secondary | ICD-10-CM | POA: Diagnosis not present

## 2012-12-08 DIAGNOSIS — M999 Biomechanical lesion, unspecified: Secondary | ICD-10-CM | POA: Diagnosis not present

## 2012-12-14 DIAGNOSIS — M5126 Other intervertebral disc displacement, lumbar region: Secondary | ICD-10-CM | POA: Diagnosis not present

## 2012-12-14 DIAGNOSIS — M999 Biomechanical lesion, unspecified: Secondary | ICD-10-CM | POA: Diagnosis not present

## 2012-12-14 DIAGNOSIS — M9981 Other biomechanical lesions of cervical region: Secondary | ICD-10-CM | POA: Diagnosis not present

## 2012-12-14 DIAGNOSIS — J309 Allergic rhinitis, unspecified: Secondary | ICD-10-CM | POA: Diagnosis not present

## 2012-12-14 DIAGNOSIS — M546 Pain in thoracic spine: Secondary | ICD-10-CM | POA: Diagnosis not present

## 2012-12-14 DIAGNOSIS — M542 Cervicalgia: Secondary | ICD-10-CM | POA: Diagnosis not present

## 2012-12-16 DIAGNOSIS — G243 Spasmodic torticollis: Secondary | ICD-10-CM | POA: Diagnosis not present

## 2012-12-16 DIAGNOSIS — G244 Idiopathic orofacial dystonia: Secondary | ICD-10-CM | POA: Diagnosis not present

## 2012-12-21 DIAGNOSIS — J309 Allergic rhinitis, unspecified: Secondary | ICD-10-CM | POA: Diagnosis not present

## 2012-12-21 DIAGNOSIS — M546 Pain in thoracic spine: Secondary | ICD-10-CM | POA: Diagnosis not present

## 2012-12-21 DIAGNOSIS — M999 Biomechanical lesion, unspecified: Secondary | ICD-10-CM | POA: Diagnosis not present

## 2012-12-21 DIAGNOSIS — M9981 Other biomechanical lesions of cervical region: Secondary | ICD-10-CM | POA: Diagnosis not present

## 2012-12-21 DIAGNOSIS — M542 Cervicalgia: Secondary | ICD-10-CM | POA: Diagnosis not present

## 2012-12-21 DIAGNOSIS — M5126 Other intervertebral disc displacement, lumbar region: Secondary | ICD-10-CM | POA: Diagnosis not present

## 2013-01-10 DIAGNOSIS — N401 Enlarged prostate with lower urinary tract symptoms: Secondary | ICD-10-CM | POA: Diagnosis not present

## 2013-01-14 DIAGNOSIS — R05 Cough: Secondary | ICD-10-CM | POA: Diagnosis not present

## 2013-01-14 DIAGNOSIS — J209 Acute bronchitis, unspecified: Secondary | ICD-10-CM | POA: Diagnosis not present

## 2013-01-14 DIAGNOSIS — J3489 Other specified disorders of nose and nasal sinuses: Secondary | ICD-10-CM | POA: Diagnosis not present

## 2013-01-14 DIAGNOSIS — R062 Wheezing: Secondary | ICD-10-CM | POA: Diagnosis not present

## 2013-01-14 DIAGNOSIS — R599 Enlarged lymph nodes, unspecified: Secondary | ICD-10-CM | POA: Diagnosis not present

## 2013-01-14 DIAGNOSIS — J029 Acute pharyngitis, unspecified: Secondary | ICD-10-CM | POA: Diagnosis not present

## 2013-01-14 DIAGNOSIS — R0602 Shortness of breath: Secondary | ICD-10-CM | POA: Diagnosis not present

## 2013-01-18 DIAGNOSIS — J309 Allergic rhinitis, unspecified: Secondary | ICD-10-CM | POA: Diagnosis not present

## 2013-01-25 DIAGNOSIS — J309 Allergic rhinitis, unspecified: Secondary | ICD-10-CM | POA: Diagnosis not present

## 2013-02-01 DIAGNOSIS — J320 Chronic maxillary sinusitis: Secondary | ICD-10-CM | POA: Diagnosis not present

## 2013-02-01 DIAGNOSIS — J3089 Other allergic rhinitis: Secondary | ICD-10-CM | POA: Diagnosis not present

## 2013-02-01 DIAGNOSIS — H1045 Other chronic allergic conjunctivitis: Secondary | ICD-10-CM | POA: Diagnosis not present

## 2013-02-01 DIAGNOSIS — R05 Cough: Secondary | ICD-10-CM | POA: Diagnosis not present

## 2013-02-01 DIAGNOSIS — J301 Allergic rhinitis due to pollen: Secondary | ICD-10-CM | POA: Diagnosis not present

## 2013-02-10 DIAGNOSIS — F411 Generalized anxiety disorder: Secondary | ICD-10-CM | POA: Diagnosis not present

## 2013-02-10 DIAGNOSIS — J309 Allergic rhinitis, unspecified: Secondary | ICD-10-CM | POA: Diagnosis not present

## 2013-02-10 DIAGNOSIS — J3089 Other allergic rhinitis: Secondary | ICD-10-CM | POA: Diagnosis not present

## 2013-02-24 DIAGNOSIS — G245 Blepharospasm: Secondary | ICD-10-CM | POA: Diagnosis not present

## 2013-03-02 DIAGNOSIS — H2589 Other age-related cataract: Secondary | ICD-10-CM | POA: Diagnosis not present

## 2013-03-02 DIAGNOSIS — H43819 Vitreous degeneration, unspecified eye: Secondary | ICD-10-CM | POA: Diagnosis not present

## 2013-03-02 DIAGNOSIS — H01029 Squamous blepharitis unspecified eye, unspecified eyelid: Secondary | ICD-10-CM | POA: Diagnosis not present

## 2013-03-04 DIAGNOSIS — E782 Mixed hyperlipidemia: Secondary | ICD-10-CM | POA: Diagnosis not present

## 2013-03-04 DIAGNOSIS — G4733 Obstructive sleep apnea (adult) (pediatric): Secondary | ICD-10-CM | POA: Diagnosis not present

## 2013-03-04 DIAGNOSIS — K3184 Gastroparesis: Secondary | ICD-10-CM | POA: Diagnosis not present

## 2013-03-04 DIAGNOSIS — G244 Idiopathic orofacial dystonia: Secondary | ICD-10-CM | POA: Diagnosis not present

## 2013-03-04 DIAGNOSIS — K219 Gastro-esophageal reflux disease without esophagitis: Secondary | ICD-10-CM | POA: Diagnosis not present

## 2013-03-04 DIAGNOSIS — M109 Gout, unspecified: Secondary | ICD-10-CM | POA: Diagnosis not present

## 2013-03-04 DIAGNOSIS — E669 Obesity, unspecified: Secondary | ICD-10-CM | POA: Diagnosis not present

## 2013-03-04 DIAGNOSIS — I10 Essential (primary) hypertension: Secondary | ICD-10-CM | POA: Diagnosis not present

## 2013-03-11 DIAGNOSIS — M9981 Other biomechanical lesions of cervical region: Secondary | ICD-10-CM | POA: Diagnosis not present

## 2013-03-11 DIAGNOSIS — M542 Cervicalgia: Secondary | ICD-10-CM | POA: Diagnosis not present

## 2013-03-11 DIAGNOSIS — M546 Pain in thoracic spine: Secondary | ICD-10-CM | POA: Diagnosis not present

## 2013-03-11 DIAGNOSIS — M5126 Other intervertebral disc displacement, lumbar region: Secondary | ICD-10-CM | POA: Diagnosis not present

## 2013-03-11 DIAGNOSIS — M999 Biomechanical lesion, unspecified: Secondary | ICD-10-CM | POA: Diagnosis not present

## 2013-03-21 DIAGNOSIS — M9981 Other biomechanical lesions of cervical region: Secondary | ICD-10-CM | POA: Diagnosis not present

## 2013-03-21 DIAGNOSIS — M546 Pain in thoracic spine: Secondary | ICD-10-CM | POA: Diagnosis not present

## 2013-03-21 DIAGNOSIS — M542 Cervicalgia: Secondary | ICD-10-CM | POA: Diagnosis not present

## 2013-03-21 DIAGNOSIS — M999 Biomechanical lesion, unspecified: Secondary | ICD-10-CM | POA: Diagnosis not present

## 2013-03-21 DIAGNOSIS — M5126 Other intervertebral disc displacement, lumbar region: Secondary | ICD-10-CM | POA: Diagnosis not present

## 2013-03-24 DIAGNOSIS — L570 Actinic keratosis: Secondary | ICD-10-CM | POA: Diagnosis not present

## 2013-03-24 DIAGNOSIS — D485 Neoplasm of uncertain behavior of skin: Secondary | ICD-10-CM | POA: Diagnosis not present

## 2013-03-24 DIAGNOSIS — L821 Other seborrheic keratosis: Secondary | ICD-10-CM | POA: Diagnosis not present

## 2013-03-29 DIAGNOSIS — M999 Biomechanical lesion, unspecified: Secondary | ICD-10-CM | POA: Diagnosis not present

## 2013-03-29 DIAGNOSIS — M5126 Other intervertebral disc displacement, lumbar region: Secondary | ICD-10-CM | POA: Diagnosis not present

## 2013-03-29 DIAGNOSIS — M9981 Other biomechanical lesions of cervical region: Secondary | ICD-10-CM | POA: Diagnosis not present

## 2013-03-29 DIAGNOSIS — J328 Other chronic sinusitis: Secondary | ICD-10-CM | POA: Diagnosis not present

## 2013-03-29 DIAGNOSIS — M542 Cervicalgia: Secondary | ICD-10-CM | POA: Diagnosis not present

## 2013-03-29 DIAGNOSIS — J301 Allergic rhinitis due to pollen: Secondary | ICD-10-CM | POA: Diagnosis not present

## 2013-03-29 DIAGNOSIS — J018 Other acute sinusitis: Secondary | ICD-10-CM | POA: Diagnosis not present

## 2013-03-29 DIAGNOSIS — M546 Pain in thoracic spine: Secondary | ICD-10-CM | POA: Diagnosis not present

## 2013-03-29 DIAGNOSIS — K219 Gastro-esophageal reflux disease without esophagitis: Secondary | ICD-10-CM | POA: Diagnosis not present

## 2013-04-01 DIAGNOSIS — R05 Cough: Secondary | ICD-10-CM | POA: Diagnosis not present

## 2013-04-04 IMAGING — CT CT ABD-PELV W/ CM
2 of 5 series · 17 of 46 positions shown, 19 images · IV contrast (READICAT/WATER & [ID] OMNI 300)
Comparison: Abdominal ultrasound 12/25/2008.  Report of abdominal
pelvic CT of 11/01/2002.

CLINICAL DATA: Right-sided abdominal pain and nausea.  Chronic,
with progression over last 6 weeks.

CT ABDOMEN AND PELVIS WITH CONTRAST
TECHNIQUE: Multidetector CT imaging of the abdomen and pelvis was
performed following the standard protocol during bolus
administration of intravenous contrast.
Contrast: 125mL OMNIPAQUE IOHEXOL 300 MG/ML IV SOLN BUN and
creatinine were obtained on site at [HOSPITAL] at [HOSPITAL]..
Results:  BUN 9.0 mg/dL,  Creatinine 1.2 mg/dL.

[Series 2: abd/pelvis with · axial · 0.88mm/px · z∈[-472,-28]mm · 14 of 100 slices shown, 16 images]
[im 5/100  soft-tissue]
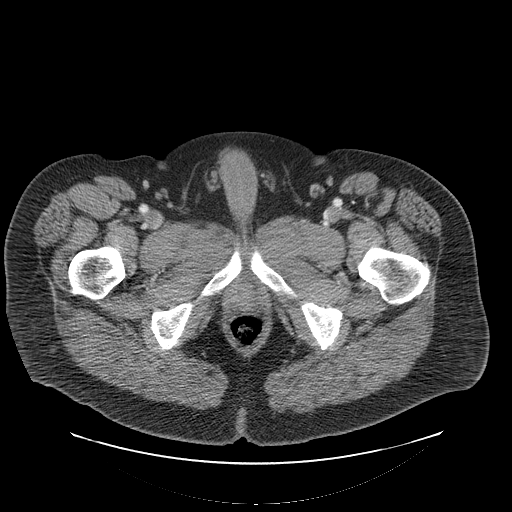
[im 5/100  bone]
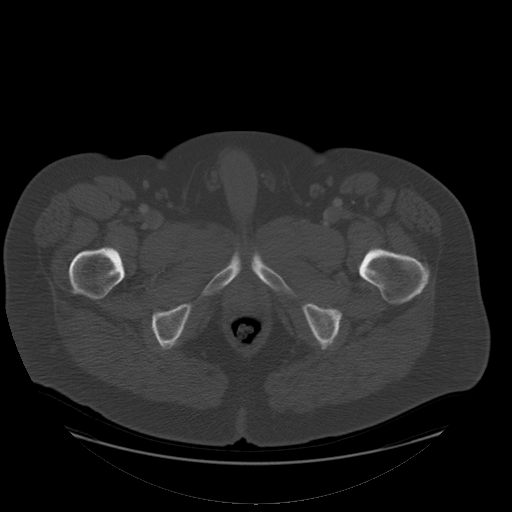
[im 15/100  soft-tissue]
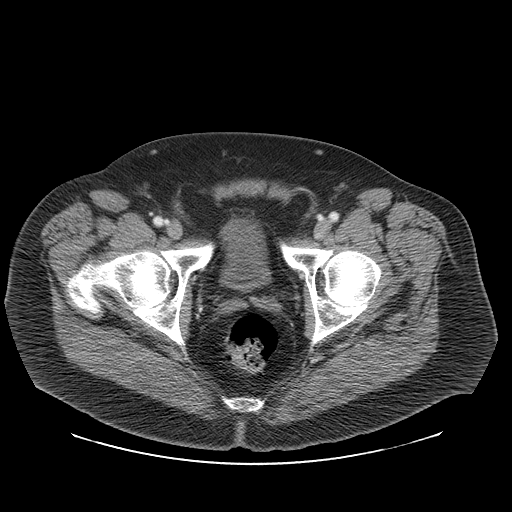
[im 20/100  soft-tissue]
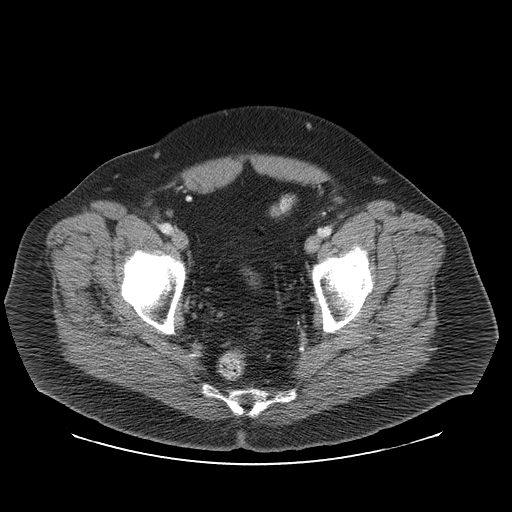
[im 25/100  soft-tissue]
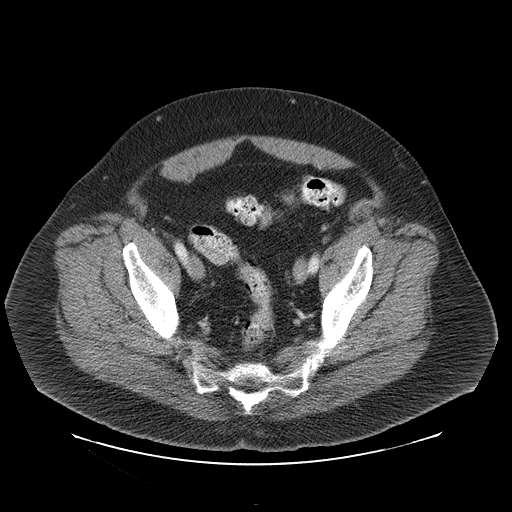
[im 35/100  soft-tissue]
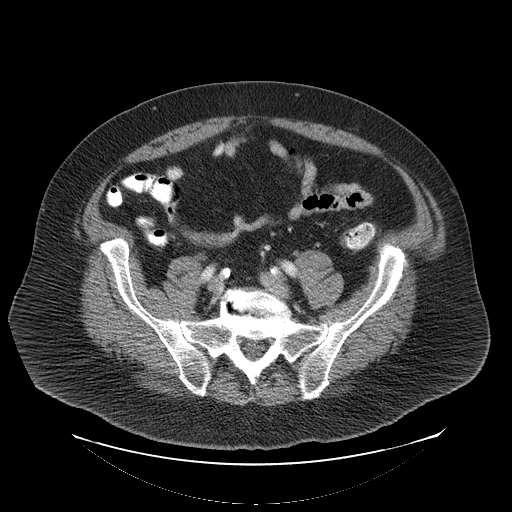
[im 40/100  soft-tissue]
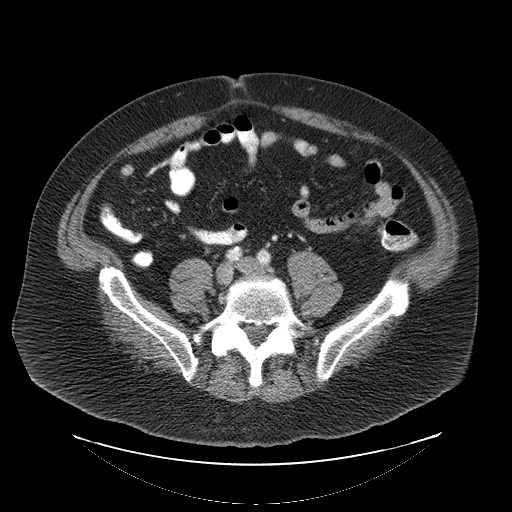
[im 45/100  soft-tissue]
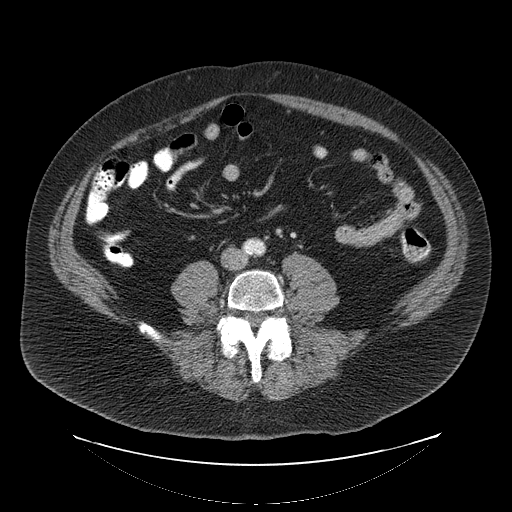
[im 55/100  soft-tissue]
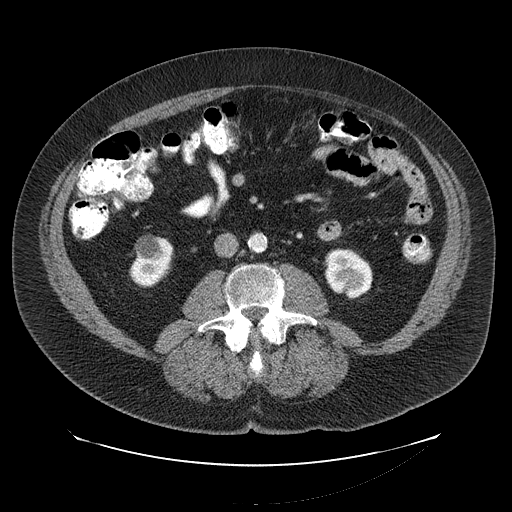
[im 60/100  soft-tissue]
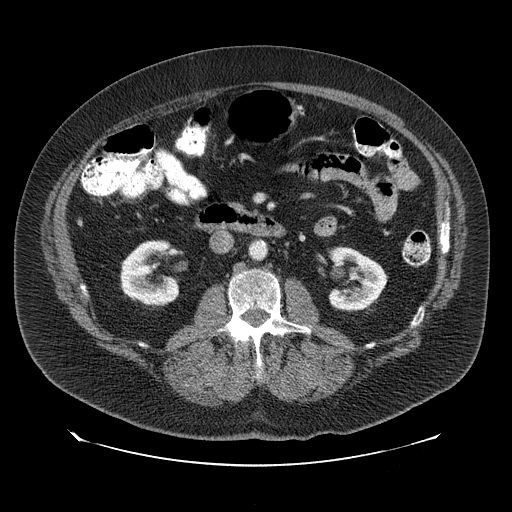
[im 60/100  bone]
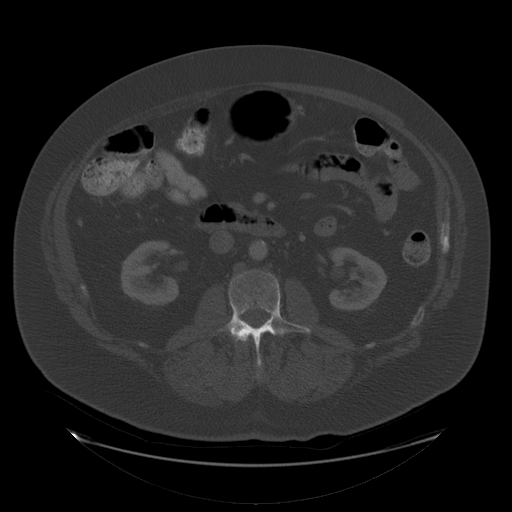
[im 65/100  soft-tissue]
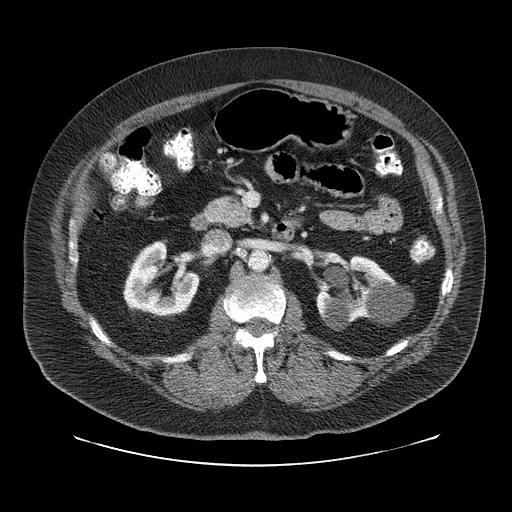
[im 75/100  soft-tissue]
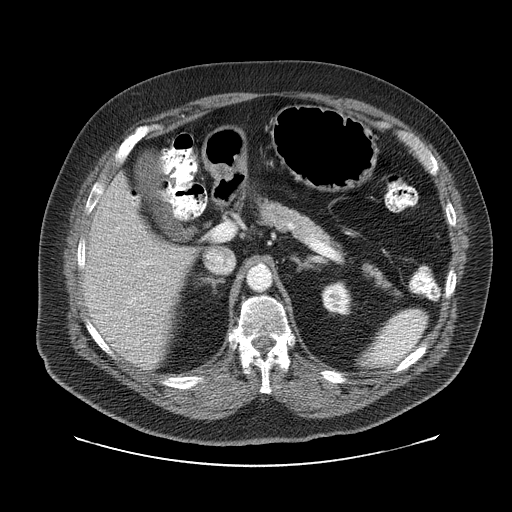
[im 80/100  soft-tissue]
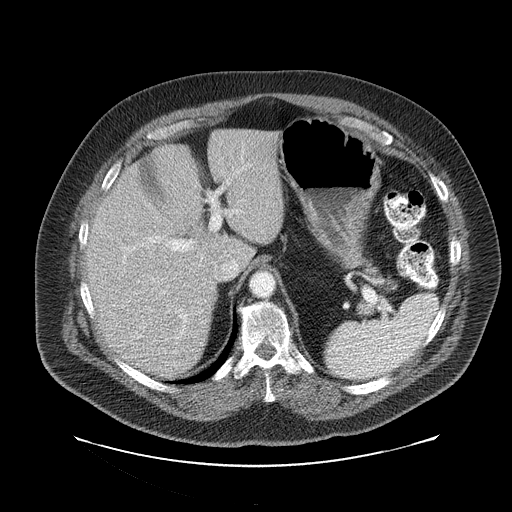
[im 85/100  soft-tissue]
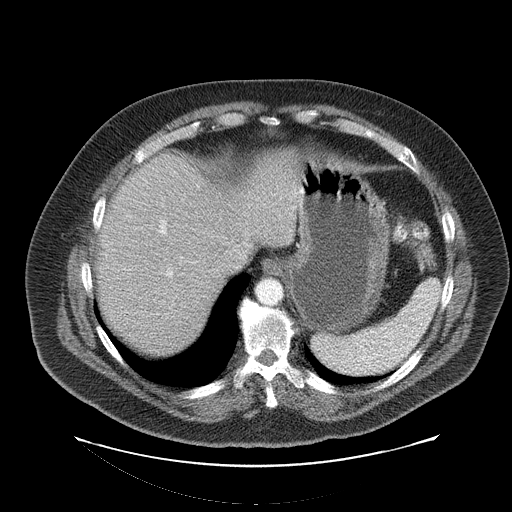
[im 95/100  soft-tissue]
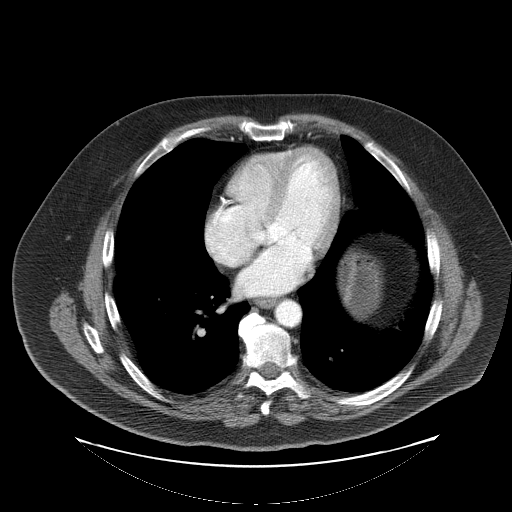

[Series 400: cor · coronal · 0.98mm/px · 3 of 147 slices shown]
[im 49/147  soft-tissue]
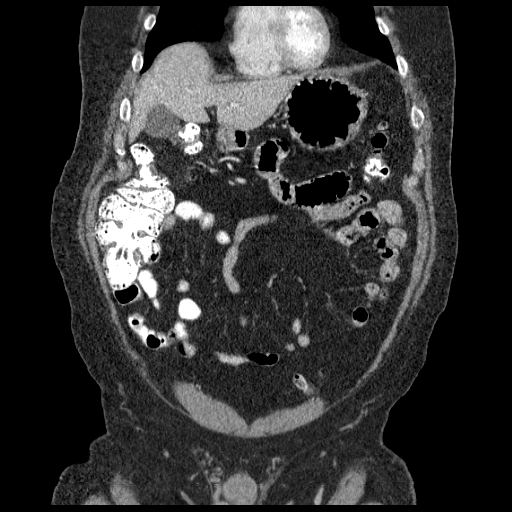
[im 65/147  soft-tissue]
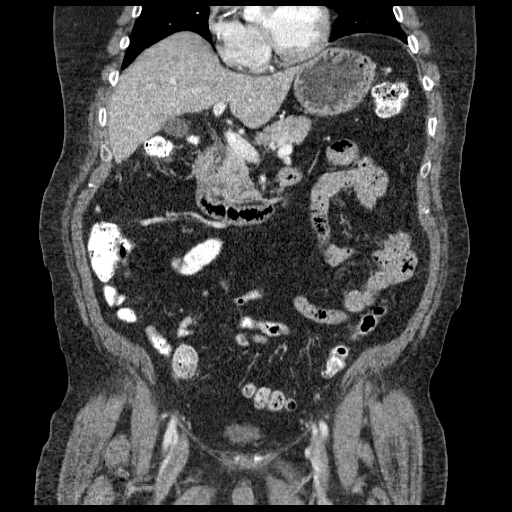
[im 82/147  soft-tissue]
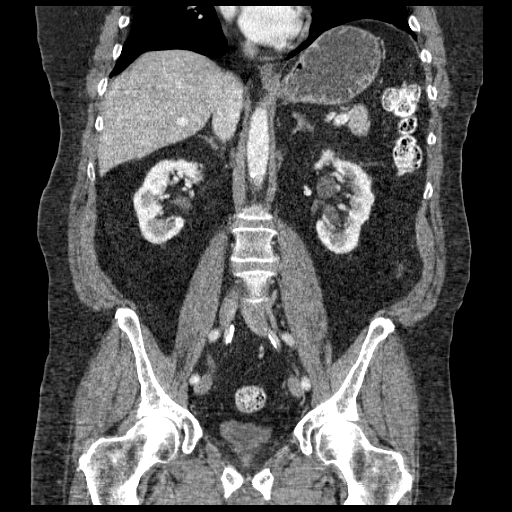

[17 of 46 positions shown; findings below may reference images not displayed]

FINDINGS: Clear lung bases. Normal heart size without pericardial
or pleural effusion.  Right coronary artery atherosclerosis on
image 4.

Normal liver, spleen, stomach, pancreas, gallbladder, biliary
tract, adrenal glands.

Bilateral renal cysts, greater in size on the left than right. No
hydroureter or ureteric calculi.  Left-sided renal sinus cysts.

No retroperitoneal or retrocrural adenopathy.

Sigmoid diverticulosis. Normal terminal ileum and appendix.

Suspect a tiny right inguinal hernia containing fat.  Asymmetry at
the origin of the inguinal canals on image 88.  There may have been
prior right inguinal hernia repair. No pelvic adenopathy.  Urinary
bladder is partially collapsed.  Normal prostate, without
significant free pelvic fluid. No acute osseous abnormality.
Degenerative disc disease at the lumbosacral junction.
IMPRESSION: No acute process or explanation for right-sided pain.

Coronary artery atherosclerosis.

## 2013-04-08 DIAGNOSIS — Z23 Encounter for immunization: Secondary | ICD-10-CM | POA: Diagnosis not present

## 2013-04-12 DIAGNOSIS — M5126 Other intervertebral disc displacement, lumbar region: Secondary | ICD-10-CM | POA: Diagnosis not present

## 2013-04-12 DIAGNOSIS — M546 Pain in thoracic spine: Secondary | ICD-10-CM | POA: Diagnosis not present

## 2013-04-12 DIAGNOSIS — M999 Biomechanical lesion, unspecified: Secondary | ICD-10-CM | POA: Diagnosis not present

## 2013-04-12 DIAGNOSIS — M9981 Other biomechanical lesions of cervical region: Secondary | ICD-10-CM | POA: Diagnosis not present

## 2013-04-12 DIAGNOSIS — M542 Cervicalgia: Secondary | ICD-10-CM | POA: Diagnosis not present

## 2013-04-22 DIAGNOSIS — M546 Pain in thoracic spine: Secondary | ICD-10-CM | POA: Diagnosis not present

## 2013-04-22 DIAGNOSIS — M542 Cervicalgia: Secondary | ICD-10-CM | POA: Diagnosis not present

## 2013-04-22 DIAGNOSIS — M999 Biomechanical lesion, unspecified: Secondary | ICD-10-CM | POA: Diagnosis not present

## 2013-04-22 DIAGNOSIS — M5126 Other intervertebral disc displacement, lumbar region: Secondary | ICD-10-CM | POA: Diagnosis not present

## 2013-04-22 DIAGNOSIS — M9981 Other biomechanical lesions of cervical region: Secondary | ICD-10-CM | POA: Diagnosis not present

## 2013-04-29 DIAGNOSIS — M9981 Other biomechanical lesions of cervical region: Secondary | ICD-10-CM | POA: Diagnosis not present

## 2013-04-29 DIAGNOSIS — M542 Cervicalgia: Secondary | ICD-10-CM | POA: Diagnosis not present

## 2013-04-29 DIAGNOSIS — M546 Pain in thoracic spine: Secondary | ICD-10-CM | POA: Diagnosis not present

## 2013-04-29 DIAGNOSIS — M999 Biomechanical lesion, unspecified: Secondary | ICD-10-CM | POA: Diagnosis not present

## 2013-04-29 DIAGNOSIS — M5126 Other intervertebral disc displacement, lumbar region: Secondary | ICD-10-CM | POA: Diagnosis not present

## 2013-05-10 DIAGNOSIS — M5126 Other intervertebral disc displacement, lumbar region: Secondary | ICD-10-CM | POA: Diagnosis not present

## 2013-05-10 DIAGNOSIS — M546 Pain in thoracic spine: Secondary | ICD-10-CM | POA: Diagnosis not present

## 2013-05-10 DIAGNOSIS — M542 Cervicalgia: Secondary | ICD-10-CM | POA: Diagnosis not present

## 2013-05-10 DIAGNOSIS — M999 Biomechanical lesion, unspecified: Secondary | ICD-10-CM | POA: Diagnosis not present

## 2013-05-10 DIAGNOSIS — M9981 Other biomechanical lesions of cervical region: Secondary | ICD-10-CM | POA: Diagnosis not present

## 2013-05-11 DIAGNOSIS — K219 Gastro-esophageal reflux disease without esophagitis: Secondary | ICD-10-CM | POA: Diagnosis not present

## 2013-05-12 DIAGNOSIS — G245 Blepharospasm: Secondary | ICD-10-CM | POA: Diagnosis not present

## 2013-05-17 DIAGNOSIS — M546 Pain in thoracic spine: Secondary | ICD-10-CM | POA: Diagnosis not present

## 2013-05-17 DIAGNOSIS — M999 Biomechanical lesion, unspecified: Secondary | ICD-10-CM | POA: Diagnosis not present

## 2013-05-17 DIAGNOSIS — M5126 Other intervertebral disc displacement, lumbar region: Secondary | ICD-10-CM | POA: Diagnosis not present

## 2013-05-17 DIAGNOSIS — M542 Cervicalgia: Secondary | ICD-10-CM | POA: Diagnosis not present

## 2013-05-17 DIAGNOSIS — M9981 Other biomechanical lesions of cervical region: Secondary | ICD-10-CM | POA: Diagnosis not present

## 2013-05-20 DIAGNOSIS — H0230 Blepharochalasis unspecified eye, unspecified eyelid: Secondary | ICD-10-CM | POA: Diagnosis not present

## 2013-05-20 DIAGNOSIS — H04129 Dry eye syndrome of unspecified lacrimal gland: Secondary | ICD-10-CM | POA: Diagnosis not present

## 2013-05-20 DIAGNOSIS — H43819 Vitreous degeneration, unspecified eye: Secondary | ICD-10-CM | POA: Diagnosis not present

## 2013-05-20 DIAGNOSIS — G245 Blepharospasm: Secondary | ICD-10-CM | POA: Diagnosis not present

## 2013-05-20 DIAGNOSIS — H16149 Punctate keratitis, unspecified eye: Secondary | ICD-10-CM | POA: Diagnosis not present

## 2013-05-20 DIAGNOSIS — H01029 Squamous blepharitis unspecified eye, unspecified eyelid: Secondary | ICD-10-CM | POA: Diagnosis not present

## 2013-05-24 DIAGNOSIS — M5126 Other intervertebral disc displacement, lumbar region: Secondary | ICD-10-CM | POA: Diagnosis not present

## 2013-05-24 DIAGNOSIS — M546 Pain in thoracic spine: Secondary | ICD-10-CM | POA: Diagnosis not present

## 2013-05-24 DIAGNOSIS — M9981 Other biomechanical lesions of cervical region: Secondary | ICD-10-CM | POA: Diagnosis not present

## 2013-05-24 DIAGNOSIS — M999 Biomechanical lesion, unspecified: Secondary | ICD-10-CM | POA: Diagnosis not present

## 2013-05-24 DIAGNOSIS — M542 Cervicalgia: Secondary | ICD-10-CM | POA: Diagnosis not present

## 2013-06-08 DIAGNOSIS — M546 Pain in thoracic spine: Secondary | ICD-10-CM | POA: Diagnosis not present

## 2013-06-08 DIAGNOSIS — M9981 Other biomechanical lesions of cervical region: Secondary | ICD-10-CM | POA: Diagnosis not present

## 2013-06-08 DIAGNOSIS — M5126 Other intervertebral disc displacement, lumbar region: Secondary | ICD-10-CM | POA: Diagnosis not present

## 2013-06-08 DIAGNOSIS — M999 Biomechanical lesion, unspecified: Secondary | ICD-10-CM | POA: Diagnosis not present

## 2013-06-08 DIAGNOSIS — M542 Cervicalgia: Secondary | ICD-10-CM | POA: Diagnosis not present

## 2013-06-14 DIAGNOSIS — M542 Cervicalgia: Secondary | ICD-10-CM | POA: Diagnosis not present

## 2013-06-14 DIAGNOSIS — M546 Pain in thoracic spine: Secondary | ICD-10-CM | POA: Diagnosis not present

## 2013-06-14 DIAGNOSIS — M9981 Other biomechanical lesions of cervical region: Secondary | ICD-10-CM | POA: Diagnosis not present

## 2013-06-14 DIAGNOSIS — M999 Biomechanical lesion, unspecified: Secondary | ICD-10-CM | POA: Diagnosis not present

## 2013-06-14 DIAGNOSIS — M5126 Other intervertebral disc displacement, lumbar region: Secondary | ICD-10-CM | POA: Diagnosis not present

## 2013-06-21 DIAGNOSIS — M546 Pain in thoracic spine: Secondary | ICD-10-CM | POA: Diagnosis not present

## 2013-06-21 DIAGNOSIS — M999 Biomechanical lesion, unspecified: Secondary | ICD-10-CM | POA: Diagnosis not present

## 2013-06-21 DIAGNOSIS — M5126 Other intervertebral disc displacement, lumbar region: Secondary | ICD-10-CM | POA: Diagnosis not present

## 2013-06-21 DIAGNOSIS — M542 Cervicalgia: Secondary | ICD-10-CM | POA: Diagnosis not present

## 2013-06-21 DIAGNOSIS — M9981 Other biomechanical lesions of cervical region: Secondary | ICD-10-CM | POA: Diagnosis not present

## 2013-07-05 DIAGNOSIS — M999 Biomechanical lesion, unspecified: Secondary | ICD-10-CM | POA: Diagnosis not present

## 2013-07-05 DIAGNOSIS — M5126 Other intervertebral disc displacement, lumbar region: Secondary | ICD-10-CM | POA: Diagnosis not present

## 2013-07-05 DIAGNOSIS — M9981 Other biomechanical lesions of cervical region: Secondary | ICD-10-CM | POA: Diagnosis not present

## 2013-07-05 DIAGNOSIS — M546 Pain in thoracic spine: Secondary | ICD-10-CM | POA: Diagnosis not present

## 2013-07-05 DIAGNOSIS — M542 Cervicalgia: Secondary | ICD-10-CM | POA: Diagnosis not present

## 2013-07-12 DIAGNOSIS — M546 Pain in thoracic spine: Secondary | ICD-10-CM | POA: Diagnosis not present

## 2013-07-12 DIAGNOSIS — M5126 Other intervertebral disc displacement, lumbar region: Secondary | ICD-10-CM | POA: Diagnosis not present

## 2013-07-12 DIAGNOSIS — M9981 Other biomechanical lesions of cervical region: Secondary | ICD-10-CM | POA: Diagnosis not present

## 2013-07-12 DIAGNOSIS — M542 Cervicalgia: Secondary | ICD-10-CM | POA: Diagnosis not present

## 2013-07-12 DIAGNOSIS — M999 Biomechanical lesion, unspecified: Secondary | ICD-10-CM | POA: Diagnosis not present

## 2013-07-14 DIAGNOSIS — G244 Idiopathic orofacial dystonia: Secondary | ICD-10-CM | POA: Diagnosis not present

## 2013-07-14 DIAGNOSIS — G243 Spasmodic torticollis: Secondary | ICD-10-CM | POA: Diagnosis not present

## 2013-07-19 DIAGNOSIS — M999 Biomechanical lesion, unspecified: Secondary | ICD-10-CM | POA: Diagnosis not present

## 2013-07-19 DIAGNOSIS — M546 Pain in thoracic spine: Secondary | ICD-10-CM | POA: Diagnosis not present

## 2013-07-19 DIAGNOSIS — M9981 Other biomechanical lesions of cervical region: Secondary | ICD-10-CM | POA: Diagnosis not present

## 2013-07-19 DIAGNOSIS — M542 Cervicalgia: Secondary | ICD-10-CM | POA: Diagnosis not present

## 2013-07-19 DIAGNOSIS — M5126 Other intervertebral disc displacement, lumbar region: Secondary | ICD-10-CM | POA: Diagnosis not present

## 2013-07-27 DIAGNOSIS — M999 Biomechanical lesion, unspecified: Secondary | ICD-10-CM | POA: Diagnosis not present

## 2013-07-27 DIAGNOSIS — M9981 Other biomechanical lesions of cervical region: Secondary | ICD-10-CM | POA: Diagnosis not present

## 2013-07-27 DIAGNOSIS — M542 Cervicalgia: Secondary | ICD-10-CM | POA: Diagnosis not present

## 2013-07-27 DIAGNOSIS — M5126 Other intervertebral disc displacement, lumbar region: Secondary | ICD-10-CM | POA: Diagnosis not present

## 2013-07-27 DIAGNOSIS — M546 Pain in thoracic spine: Secondary | ICD-10-CM | POA: Diagnosis not present

## 2013-08-04 DIAGNOSIS — G245 Blepharospasm: Secondary | ICD-10-CM | POA: Diagnosis not present

## 2013-08-05 DIAGNOSIS — K219 Gastro-esophageal reflux disease without esophagitis: Secondary | ICD-10-CM | POA: Diagnosis not present

## 2013-08-09 DIAGNOSIS — M546 Pain in thoracic spine: Secondary | ICD-10-CM | POA: Diagnosis not present

## 2013-08-09 DIAGNOSIS — M542 Cervicalgia: Secondary | ICD-10-CM | POA: Diagnosis not present

## 2013-08-09 DIAGNOSIS — M999 Biomechanical lesion, unspecified: Secondary | ICD-10-CM | POA: Diagnosis not present

## 2013-08-09 DIAGNOSIS — M9981 Other biomechanical lesions of cervical region: Secondary | ICD-10-CM | POA: Diagnosis not present

## 2013-08-09 DIAGNOSIS — M5126 Other intervertebral disc displacement, lumbar region: Secondary | ICD-10-CM | POA: Diagnosis not present

## 2013-08-16 DIAGNOSIS — M5126 Other intervertebral disc displacement, lumbar region: Secondary | ICD-10-CM | POA: Diagnosis not present

## 2013-08-16 DIAGNOSIS — M9981 Other biomechanical lesions of cervical region: Secondary | ICD-10-CM | POA: Diagnosis not present

## 2013-08-16 DIAGNOSIS — M546 Pain in thoracic spine: Secondary | ICD-10-CM | POA: Diagnosis not present

## 2013-08-16 DIAGNOSIS — M542 Cervicalgia: Secondary | ICD-10-CM | POA: Diagnosis not present

## 2013-08-16 DIAGNOSIS — M999 Biomechanical lesion, unspecified: Secondary | ICD-10-CM | POA: Diagnosis not present

## 2013-08-24 DIAGNOSIS — M542 Cervicalgia: Secondary | ICD-10-CM | POA: Diagnosis not present

## 2013-08-24 DIAGNOSIS — M5126 Other intervertebral disc displacement, lumbar region: Secondary | ICD-10-CM | POA: Diagnosis not present

## 2013-08-24 DIAGNOSIS — M9981 Other biomechanical lesions of cervical region: Secondary | ICD-10-CM | POA: Diagnosis not present

## 2013-08-24 DIAGNOSIS — M999 Biomechanical lesion, unspecified: Secondary | ICD-10-CM | POA: Diagnosis not present

## 2013-08-24 DIAGNOSIS — M546 Pain in thoracic spine: Secondary | ICD-10-CM | POA: Diagnosis not present

## 2013-09-05 DIAGNOSIS — K219 Gastro-esophageal reflux disease without esophagitis: Secondary | ICD-10-CM | POA: Diagnosis not present

## 2013-09-05 DIAGNOSIS — G4733 Obstructive sleep apnea (adult) (pediatric): Secondary | ICD-10-CM | POA: Diagnosis not present

## 2013-09-05 DIAGNOSIS — J329 Chronic sinusitis, unspecified: Secondary | ICD-10-CM | POA: Diagnosis not present

## 2013-09-05 DIAGNOSIS — I10 Essential (primary) hypertension: Secondary | ICD-10-CM | POA: Diagnosis not present

## 2013-09-05 DIAGNOSIS — G244 Idiopathic orofacial dystonia: Secondary | ICD-10-CM | POA: Diagnosis not present

## 2013-09-05 DIAGNOSIS — E782 Mixed hyperlipidemia: Secondary | ICD-10-CM | POA: Diagnosis not present

## 2013-09-05 DIAGNOSIS — Z23 Encounter for immunization: Secondary | ICD-10-CM | POA: Diagnosis not present

## 2013-09-05 DIAGNOSIS — N400 Enlarged prostate without lower urinary tract symptoms: Secondary | ICD-10-CM | POA: Diagnosis not present

## 2013-09-05 DIAGNOSIS — G479 Sleep disorder, unspecified: Secondary | ICD-10-CM | POA: Diagnosis not present

## 2013-09-05 DIAGNOSIS — M109 Gout, unspecified: Secondary | ICD-10-CM | POA: Diagnosis not present

## 2013-09-06 DIAGNOSIS — M999 Biomechanical lesion, unspecified: Secondary | ICD-10-CM | POA: Diagnosis not present

## 2013-09-06 DIAGNOSIS — M5126 Other intervertebral disc displacement, lumbar region: Secondary | ICD-10-CM | POA: Diagnosis not present

## 2013-09-06 DIAGNOSIS — M9981 Other biomechanical lesions of cervical region: Secondary | ICD-10-CM | POA: Diagnosis not present

## 2013-09-06 DIAGNOSIS — M546 Pain in thoracic spine: Secondary | ICD-10-CM | POA: Diagnosis not present

## 2013-09-06 DIAGNOSIS — M542 Cervicalgia: Secondary | ICD-10-CM | POA: Diagnosis not present

## 2013-09-13 DIAGNOSIS — M9981 Other biomechanical lesions of cervical region: Secondary | ICD-10-CM | POA: Diagnosis not present

## 2013-09-13 DIAGNOSIS — M5126 Other intervertebral disc displacement, lumbar region: Secondary | ICD-10-CM | POA: Diagnosis not present

## 2013-09-13 DIAGNOSIS — M999 Biomechanical lesion, unspecified: Secondary | ICD-10-CM | POA: Diagnosis not present

## 2013-09-13 DIAGNOSIS — J301 Allergic rhinitis due to pollen: Secondary | ICD-10-CM | POA: Diagnosis not present

## 2013-09-13 DIAGNOSIS — M542 Cervicalgia: Secondary | ICD-10-CM | POA: Diagnosis not present

## 2013-09-13 DIAGNOSIS — R059 Cough, unspecified: Secondary | ICD-10-CM | POA: Diagnosis not present

## 2013-09-13 DIAGNOSIS — M546 Pain in thoracic spine: Secondary | ICD-10-CM | POA: Diagnosis not present

## 2013-09-13 DIAGNOSIS — J3089 Other allergic rhinitis: Secondary | ICD-10-CM | POA: Diagnosis not present

## 2013-09-13 DIAGNOSIS — H1045 Other chronic allergic conjunctivitis: Secondary | ICD-10-CM | POA: Diagnosis not present

## 2013-09-13 DIAGNOSIS — R05 Cough: Secondary | ICD-10-CM | POA: Diagnosis not present

## 2013-09-19 DIAGNOSIS — J309 Allergic rhinitis, unspecified: Secondary | ICD-10-CM | POA: Diagnosis not present

## 2013-09-24 ENCOUNTER — Encounter: Payer: Self-pay | Admitting: *Deleted

## 2013-09-24 DIAGNOSIS — I10 Essential (primary) hypertension: Secondary | ICD-10-CM | POA: Insufficient documentation

## 2013-09-24 DIAGNOSIS — M109 Gout, unspecified: Secondary | ICD-10-CM | POA: Insufficient documentation

## 2013-09-24 DIAGNOSIS — E785 Hyperlipidemia, unspecified: Secondary | ICD-10-CM

## 2013-09-27 DIAGNOSIS — M999 Biomechanical lesion, unspecified: Secondary | ICD-10-CM | POA: Diagnosis not present

## 2013-09-27 DIAGNOSIS — M542 Cervicalgia: Secondary | ICD-10-CM | POA: Diagnosis not present

## 2013-09-27 DIAGNOSIS — J309 Allergic rhinitis, unspecified: Secondary | ICD-10-CM | POA: Diagnosis not present

## 2013-09-27 DIAGNOSIS — M546 Pain in thoracic spine: Secondary | ICD-10-CM | POA: Diagnosis not present

## 2013-09-27 DIAGNOSIS — M9981 Other biomechanical lesions of cervical region: Secondary | ICD-10-CM | POA: Diagnosis not present

## 2013-09-27 DIAGNOSIS — M5126 Other intervertebral disc displacement, lumbar region: Secondary | ICD-10-CM | POA: Diagnosis not present

## 2013-10-04 DIAGNOSIS — J309 Allergic rhinitis, unspecified: Secondary | ICD-10-CM | POA: Diagnosis not present

## 2013-10-11 DIAGNOSIS — J309 Allergic rhinitis, unspecified: Secondary | ICD-10-CM | POA: Diagnosis not present

## 2013-10-11 DIAGNOSIS — M5126 Other intervertebral disc displacement, lumbar region: Secondary | ICD-10-CM | POA: Diagnosis not present

## 2013-10-11 DIAGNOSIS — M542 Cervicalgia: Secondary | ICD-10-CM | POA: Diagnosis not present

## 2013-10-11 DIAGNOSIS — M9981 Other biomechanical lesions of cervical region: Secondary | ICD-10-CM | POA: Diagnosis not present

## 2013-10-11 DIAGNOSIS — M999 Biomechanical lesion, unspecified: Secondary | ICD-10-CM | POA: Diagnosis not present

## 2013-10-11 DIAGNOSIS — M546 Pain in thoracic spine: Secondary | ICD-10-CM | POA: Diagnosis not present

## 2013-10-12 DIAGNOSIS — G4733 Obstructive sleep apnea (adult) (pediatric): Secondary | ICD-10-CM | POA: Diagnosis not present

## 2013-10-18 DIAGNOSIS — J309 Allergic rhinitis, unspecified: Secondary | ICD-10-CM | POA: Diagnosis not present

## 2013-10-18 DIAGNOSIS — M546 Pain in thoracic spine: Secondary | ICD-10-CM | POA: Diagnosis not present

## 2013-10-18 DIAGNOSIS — M542 Cervicalgia: Secondary | ICD-10-CM | POA: Diagnosis not present

## 2013-10-18 DIAGNOSIS — M9981 Other biomechanical lesions of cervical region: Secondary | ICD-10-CM | POA: Diagnosis not present

## 2013-10-18 DIAGNOSIS — M5126 Other intervertebral disc displacement, lumbar region: Secondary | ICD-10-CM | POA: Diagnosis not present

## 2013-10-18 DIAGNOSIS — M999 Biomechanical lesion, unspecified: Secondary | ICD-10-CM | POA: Diagnosis not present

## 2013-10-20 DIAGNOSIS — G245 Blepharospasm: Secondary | ICD-10-CM | POA: Diagnosis not present

## 2013-10-25 DIAGNOSIS — J309 Allergic rhinitis, unspecified: Secondary | ICD-10-CM | POA: Diagnosis not present

## 2013-10-25 DIAGNOSIS — M542 Cervicalgia: Secondary | ICD-10-CM | POA: Diagnosis not present

## 2013-10-25 DIAGNOSIS — M546 Pain in thoracic spine: Secondary | ICD-10-CM | POA: Diagnosis not present

## 2013-10-25 DIAGNOSIS — M999 Biomechanical lesion, unspecified: Secondary | ICD-10-CM | POA: Diagnosis not present

## 2013-10-25 DIAGNOSIS — M5126 Other intervertebral disc displacement, lumbar region: Secondary | ICD-10-CM | POA: Diagnosis not present

## 2013-10-25 DIAGNOSIS — M9981 Other biomechanical lesions of cervical region: Secondary | ICD-10-CM | POA: Diagnosis not present

## 2013-11-01 DIAGNOSIS — J309 Allergic rhinitis, unspecified: Secondary | ICD-10-CM | POA: Diagnosis not present

## 2013-11-08 DIAGNOSIS — M542 Cervicalgia: Secondary | ICD-10-CM | POA: Diagnosis not present

## 2013-11-08 DIAGNOSIS — M546 Pain in thoracic spine: Secondary | ICD-10-CM | POA: Diagnosis not present

## 2013-11-08 DIAGNOSIS — M999 Biomechanical lesion, unspecified: Secondary | ICD-10-CM | POA: Diagnosis not present

## 2013-11-08 DIAGNOSIS — M9981 Other biomechanical lesions of cervical region: Secondary | ICD-10-CM | POA: Diagnosis not present

## 2013-11-08 DIAGNOSIS — J309 Allergic rhinitis, unspecified: Secondary | ICD-10-CM | POA: Diagnosis not present

## 2013-11-08 DIAGNOSIS — M5126 Other intervertebral disc displacement, lumbar region: Secondary | ICD-10-CM | POA: Diagnosis not present

## 2013-11-15 DIAGNOSIS — M999 Biomechanical lesion, unspecified: Secondary | ICD-10-CM | POA: Diagnosis not present

## 2013-11-15 DIAGNOSIS — M542 Cervicalgia: Secondary | ICD-10-CM | POA: Diagnosis not present

## 2013-11-15 DIAGNOSIS — M546 Pain in thoracic spine: Secondary | ICD-10-CM | POA: Diagnosis not present

## 2013-11-15 DIAGNOSIS — J309 Allergic rhinitis, unspecified: Secondary | ICD-10-CM | POA: Diagnosis not present

## 2013-11-15 DIAGNOSIS — M5126 Other intervertebral disc displacement, lumbar region: Secondary | ICD-10-CM | POA: Diagnosis not present

## 2013-11-15 DIAGNOSIS — M9981 Other biomechanical lesions of cervical region: Secondary | ICD-10-CM | POA: Diagnosis not present

## 2013-11-22 DIAGNOSIS — M5126 Other intervertebral disc displacement, lumbar region: Secondary | ICD-10-CM | POA: Diagnosis not present

## 2013-11-22 DIAGNOSIS — M542 Cervicalgia: Secondary | ICD-10-CM | POA: Diagnosis not present

## 2013-11-22 DIAGNOSIS — J309 Allergic rhinitis, unspecified: Secondary | ICD-10-CM | POA: Diagnosis not present

## 2013-11-22 DIAGNOSIS — M546 Pain in thoracic spine: Secondary | ICD-10-CM | POA: Diagnosis not present

## 2013-11-22 DIAGNOSIS — M9981 Other biomechanical lesions of cervical region: Secondary | ICD-10-CM | POA: Diagnosis not present

## 2013-11-22 DIAGNOSIS — M999 Biomechanical lesion, unspecified: Secondary | ICD-10-CM | POA: Diagnosis not present

## 2013-11-29 DIAGNOSIS — J309 Allergic rhinitis, unspecified: Secondary | ICD-10-CM | POA: Diagnosis not present

## 2013-12-06 DIAGNOSIS — M999 Biomechanical lesion, unspecified: Secondary | ICD-10-CM | POA: Diagnosis not present

## 2013-12-06 DIAGNOSIS — J309 Allergic rhinitis, unspecified: Secondary | ICD-10-CM | POA: Diagnosis not present

## 2013-12-06 DIAGNOSIS — M9981 Other biomechanical lesions of cervical region: Secondary | ICD-10-CM | POA: Diagnosis not present

## 2013-12-06 DIAGNOSIS — M546 Pain in thoracic spine: Secondary | ICD-10-CM | POA: Diagnosis not present

## 2013-12-06 DIAGNOSIS — M542 Cervicalgia: Secondary | ICD-10-CM | POA: Diagnosis not present

## 2013-12-06 DIAGNOSIS — M5126 Other intervertebral disc displacement, lumbar region: Secondary | ICD-10-CM | POA: Diagnosis not present

## 2013-12-13 DIAGNOSIS — M542 Cervicalgia: Secondary | ICD-10-CM | POA: Diagnosis not present

## 2013-12-13 DIAGNOSIS — M546 Pain in thoracic spine: Secondary | ICD-10-CM | POA: Diagnosis not present

## 2013-12-13 DIAGNOSIS — M9981 Other biomechanical lesions of cervical region: Secondary | ICD-10-CM | POA: Diagnosis not present

## 2013-12-13 DIAGNOSIS — M5126 Other intervertebral disc displacement, lumbar region: Secondary | ICD-10-CM | POA: Diagnosis not present

## 2013-12-13 DIAGNOSIS — J309 Allergic rhinitis, unspecified: Secondary | ICD-10-CM | POA: Diagnosis not present

## 2013-12-13 DIAGNOSIS — M999 Biomechanical lesion, unspecified: Secondary | ICD-10-CM | POA: Diagnosis not present

## 2013-12-20 DIAGNOSIS — M5126 Other intervertebral disc displacement, lumbar region: Secondary | ICD-10-CM | POA: Diagnosis not present

## 2013-12-20 DIAGNOSIS — M999 Biomechanical lesion, unspecified: Secondary | ICD-10-CM | POA: Diagnosis not present

## 2013-12-20 DIAGNOSIS — J309 Allergic rhinitis, unspecified: Secondary | ICD-10-CM | POA: Diagnosis not present

## 2013-12-20 DIAGNOSIS — M542 Cervicalgia: Secondary | ICD-10-CM | POA: Diagnosis not present

## 2013-12-20 DIAGNOSIS — M9981 Other biomechanical lesions of cervical region: Secondary | ICD-10-CM | POA: Diagnosis not present

## 2013-12-20 DIAGNOSIS — M546 Pain in thoracic spine: Secondary | ICD-10-CM | POA: Diagnosis not present

## 2013-12-27 DIAGNOSIS — M999 Biomechanical lesion, unspecified: Secondary | ICD-10-CM | POA: Diagnosis not present

## 2013-12-27 DIAGNOSIS — M546 Pain in thoracic spine: Secondary | ICD-10-CM | POA: Diagnosis not present

## 2013-12-27 DIAGNOSIS — J309 Allergic rhinitis, unspecified: Secondary | ICD-10-CM | POA: Diagnosis not present

## 2013-12-27 DIAGNOSIS — M5126 Other intervertebral disc displacement, lumbar region: Secondary | ICD-10-CM | POA: Diagnosis not present

## 2013-12-27 DIAGNOSIS — M542 Cervicalgia: Secondary | ICD-10-CM | POA: Diagnosis not present

## 2013-12-27 DIAGNOSIS — M9981 Other biomechanical lesions of cervical region: Secondary | ICD-10-CM | POA: Diagnosis not present

## 2013-12-29 DIAGNOSIS — G244 Idiopathic orofacial dystonia: Secondary | ICD-10-CM | POA: Diagnosis not present

## 2013-12-29 DIAGNOSIS — G243 Spasmodic torticollis: Secondary | ICD-10-CM | POA: Diagnosis not present

## 2014-01-03 DIAGNOSIS — J309 Allergic rhinitis, unspecified: Secondary | ICD-10-CM | POA: Diagnosis not present

## 2014-01-05 DIAGNOSIS — G245 Blepharospasm: Secondary | ICD-10-CM | POA: Diagnosis not present

## 2014-01-10 DIAGNOSIS — J309 Allergic rhinitis, unspecified: Secondary | ICD-10-CM | POA: Diagnosis not present

## 2014-01-17 DIAGNOSIS — J309 Allergic rhinitis, unspecified: Secondary | ICD-10-CM | POA: Diagnosis not present

## 2014-01-24 DIAGNOSIS — J309 Allergic rhinitis, unspecified: Secondary | ICD-10-CM | POA: Diagnosis not present

## 2014-01-30 DIAGNOSIS — K219 Gastro-esophageal reflux disease without esophagitis: Secondary | ICD-10-CM | POA: Diagnosis not present

## 2014-01-30 DIAGNOSIS — R6889 Other general symptoms and signs: Secondary | ICD-10-CM | POA: Diagnosis not present

## 2014-01-31 DIAGNOSIS — J309 Allergic rhinitis, unspecified: Secondary | ICD-10-CM | POA: Diagnosis not present

## 2014-02-02 DIAGNOSIS — J310 Chronic rhinitis: Secondary | ICD-10-CM | POA: Diagnosis not present

## 2014-02-07 DIAGNOSIS — J309 Allergic rhinitis, unspecified: Secondary | ICD-10-CM | POA: Diagnosis not present

## 2014-02-21 DIAGNOSIS — J309 Allergic rhinitis, unspecified: Secondary | ICD-10-CM | POA: Diagnosis not present

## 2014-03-03 DIAGNOSIS — R079 Chest pain, unspecified: Secondary | ICD-10-CM | POA: Diagnosis not present

## 2014-03-03 DIAGNOSIS — K219 Gastro-esophageal reflux disease without esophagitis: Secondary | ICD-10-CM | POA: Diagnosis not present

## 2014-03-06 DIAGNOSIS — R109 Unspecified abdominal pain: Secondary | ICD-10-CM | POA: Diagnosis not present

## 2014-03-23 DIAGNOSIS — G245 Blepharospasm: Secondary | ICD-10-CM | POA: Diagnosis not present

## 2014-03-27 DIAGNOSIS — H2510 Age-related nuclear cataract, unspecified eye: Secondary | ICD-10-CM | POA: Diagnosis not present

## 2014-03-28 DIAGNOSIS — L821 Other seborrheic keratosis: Secondary | ICD-10-CM | POA: Diagnosis not present

## 2014-03-28 DIAGNOSIS — L820 Inflamed seborrheic keratosis: Secondary | ICD-10-CM | POA: Diagnosis not present

## 2014-03-28 DIAGNOSIS — L570 Actinic keratosis: Secondary | ICD-10-CM | POA: Diagnosis not present

## 2014-03-30 DIAGNOSIS — R1310 Dysphagia, unspecified: Secondary | ICD-10-CM | POA: Diagnosis not present

## 2014-03-30 DIAGNOSIS — R1013 Epigastric pain: Secondary | ICD-10-CM | POA: Diagnosis not present

## 2014-03-30 DIAGNOSIS — D131 Benign neoplasm of stomach: Secondary | ICD-10-CM | POA: Diagnosis not present

## 2014-03-30 DIAGNOSIS — D126 Benign neoplasm of colon, unspecified: Secondary | ICD-10-CM | POA: Diagnosis not present

## 2014-03-30 DIAGNOSIS — R0789 Other chest pain: Secondary | ICD-10-CM | POA: Diagnosis not present

## 2014-04-18 DIAGNOSIS — Z23 Encounter for immunization: Secondary | ICD-10-CM | POA: Diagnosis not present

## 2014-04-24 DIAGNOSIS — M109 Gout, unspecified: Secondary | ICD-10-CM | POA: Diagnosis not present

## 2014-04-24 DIAGNOSIS — K219 Gastro-esophageal reflux disease without esophagitis: Secondary | ICD-10-CM | POA: Diagnosis not present

## 2014-04-24 DIAGNOSIS — G479 Sleep disorder, unspecified: Secondary | ICD-10-CM | POA: Diagnosis not present

## 2014-04-24 DIAGNOSIS — I10 Essential (primary) hypertension: Secondary | ICD-10-CM | POA: Diagnosis not present

## 2014-04-24 DIAGNOSIS — E782 Mixed hyperlipidemia: Secondary | ICD-10-CM | POA: Diagnosis not present

## 2014-04-24 DIAGNOSIS — R350 Frequency of micturition: Secondary | ICD-10-CM | POA: Diagnosis not present

## 2014-04-24 DIAGNOSIS — G4733 Obstructive sleep apnea (adult) (pediatric): Secondary | ICD-10-CM | POA: Diagnosis not present

## 2014-04-24 DIAGNOSIS — Z205 Contact with and (suspected) exposure to viral hepatitis: Secondary | ICD-10-CM | POA: Diagnosis not present

## 2014-04-24 DIAGNOSIS — G244 Idiopathic orofacial dystonia: Secondary | ICD-10-CM | POA: Diagnosis not present

## 2014-05-30 ENCOUNTER — Emergency Department (HOSPITAL_COMMUNITY)
Admission: EM | Admit: 2014-05-30 | Discharge: 2014-05-30 | Disposition: A | Discharge status: Home / Self Care | Payer: Medicare Other | Attending: Emergency Medicine | Admitting: Emergency Medicine

## 2014-05-30 ENCOUNTER — Emergency Department (HOSPITAL_COMMUNITY): Payer: Medicare Other

## 2014-05-30 ENCOUNTER — Encounter (HOSPITAL_COMMUNITY): Payer: Self-pay | Admitting: Emergency Medicine

## 2014-05-30 DIAGNOSIS — K429 Umbilical hernia without obstruction or gangrene: Secondary | ICD-10-CM | POA: Diagnosis not present

## 2014-05-30 DIAGNOSIS — I10 Essential (primary) hypertension: Secondary | ICD-10-CM | POA: Insufficient documentation

## 2014-05-30 DIAGNOSIS — F419 Anxiety disorder, unspecified: Secondary | ICD-10-CM | POA: Diagnosis not present

## 2014-05-30 DIAGNOSIS — Z9889 Other specified postprocedural states: Secondary | ICD-10-CM | POA: Diagnosis not present

## 2014-05-30 DIAGNOSIS — Z7982 Long term (current) use of aspirin: Secondary | ICD-10-CM | POA: Diagnosis not present

## 2014-05-30 DIAGNOSIS — Z791 Long term (current) use of non-steroidal anti-inflammatories (NSAID): Secondary | ICD-10-CM | POA: Diagnosis not present

## 2014-05-30 DIAGNOSIS — K439 Ventral hernia without obstruction or gangrene: Secondary | ICD-10-CM

## 2014-05-30 DIAGNOSIS — Z91040 Latex allergy status: Secondary | ICD-10-CM | POA: Diagnosis not present

## 2014-05-30 DIAGNOSIS — I2510 Atherosclerotic heart disease of native coronary artery without angina pectoris: Secondary | ICD-10-CM | POA: Insufficient documentation

## 2014-05-30 DIAGNOSIS — R109 Unspecified abdominal pain: Secondary | ICD-10-CM

## 2014-05-30 DIAGNOSIS — M109 Gout, unspecified: Secondary | ICD-10-CM | POA: Diagnosis not present

## 2014-05-30 DIAGNOSIS — E785 Hyperlipidemia, unspecified: Secondary | ICD-10-CM | POA: Diagnosis not present

## 2014-05-30 DIAGNOSIS — R1033 Periumbilical pain: Secondary | ICD-10-CM | POA: Diagnosis not present

## 2014-05-30 DIAGNOSIS — Z79899 Other long term (current) drug therapy: Secondary | ICD-10-CM | POA: Insufficient documentation

## 2014-05-30 DIAGNOSIS — Z880 Allergy status to penicillin: Secondary | ICD-10-CM | POA: Diagnosis not present

## 2014-05-30 LAB — CBC WITH DIFFERENTIAL/PLATELET
Basophils Absolute: 0 10*3/uL (ref 0.0–0.1)
Basophils Relative: 0 % (ref 0–1)
Eosinophils Absolute: 0.2 10*3/uL (ref 0.0–0.7)
Eosinophils Relative: 3 % (ref 0–5)
HCT: 43.9 % (ref 39.0–52.0)
HEMOGLOBIN: 14.3 g/dL (ref 13.0–17.0)
LYMPHS ABS: 1.8 10*3/uL (ref 0.7–4.0)
LYMPHS PCT: 30 % (ref 12–46)
MCH: 31.2 pg (ref 26.0–34.0)
MCHC: 32.6 g/dL (ref 30.0–36.0)
MCV: 95.9 fL (ref 78.0–100.0)
MONOS PCT: 7 % (ref 3–12)
Monocytes Absolute: 0.4 10*3/uL (ref 0.1–1.0)
NEUTROS ABS: 3.6 10*3/uL (ref 1.7–7.7)
NEUTROS PCT: 60 % (ref 43–77)
PLATELETS: 159 10*3/uL (ref 150–400)
RBC: 4.58 MIL/uL (ref 4.22–5.81)
RDW: 13.6 % (ref 11.5–15.5)
WBC: 5.9 10*3/uL (ref 4.0–10.5)

## 2014-05-30 LAB — BASIC METABOLIC PANEL
Anion gap: 10 (ref 5–15)
BUN: 12 mg/dL (ref 6–23)
CHLORIDE: 99 meq/L (ref 96–112)
CO2: 30 meq/L (ref 19–32)
Calcium: 10.1 mg/dL (ref 8.4–10.5)
Creatinine, Ser: 1.1 mg/dL (ref 0.50–1.35)
GFR calc Af Amer: 78 mL/min — ABNORMAL LOW (ref >90)
GFR, EST NON AFRICAN AMERICAN: 67 mL/min — AB (ref >90)
GLUCOSE: 93 mg/dL (ref 70–99)
POTASSIUM: 4.6 meq/L (ref 3.7–5.3)
SODIUM: 139 meq/L (ref 137–147)

## 2014-05-30 MED ORDER — IOHEXOL 300 MG/ML  SOLN
100.0000 mL | Freq: Once | INTRAMUSCULAR | Status: AC | PRN
  Administered 2014-05-30: 100 mL via INTRAVENOUS

## 2014-05-30 NOTE — ED Provider Notes (Signed)
CSN: [id number]     Arrival date & time 05/30/14  1623 History   First MD Initiated Contact with Patient 05/30/14 1640     Chief Complaint  Patient presents with  . Abdominal Pain    Sent by Sadie Haber MD for evaluation of possible umbilical hernia     (Consider location/radiation/quality/duration/timing/severity/associated sxs/prior Treatment) Patient is a 68 y.o. male presenting with abdominal pain. The history is provided by the patient and medical records.  Abdominal Pain Pain location:  Periumbilical Pain quality: gnawing   Pain radiates to:  Does not radiate Pain severity:  Mild Onset quality:  Sudden Timing:  Intermittent Progression:  Resolved Chronicity:  New Associated symptoms: no diarrhea, no nausea and no vomiting     Past Medical History  Diagnosis Date  . Hypertension   . Hyperlipidemia   . Gout   . Anxiety   . Coronary atherosclerosis    Past Surgical History  Procedure Laterality Date  . Cardiac catheterization     Family History  Problem Relation Age of Onset  . Family history unknown: Yes   History  Substance Use Topics  . Smoking status: Never Smoker   . Smokeless tobacco: Not on file  . Alcohol Use: No    Review of Systems  Gastrointestinal: Positive for abdominal pain. Negative for nausea, vomiting and diarrhea.  All other systems reviewed and are negative.     Allergies  Hydrocodone; Latex; and Penicillins  Home Medications   Prior to Admission medications   Medication Sig Start Date End Date Taking? Authorizing Provider  allopurinol (ZYLOPRIM) 300 MG tablet Take 300 mg by mouth daily.    Historical Provider, MD  aspirin 325 MG tablet Take 325 mg by mouth daily.    Historical Provider, MD  celecoxib (CELEBREX) 200 MG capsule Take 200 mg by mouth 2 (two) times daily.    Historical Provider, MD  clonazePAM (KLONOPIN) 0.5 MG tablet Take 0.5 mg by mouth 2 (two) times daily as needed for anxiety.    Historical Provider, MD  esomeprazole  (NEXIUM) 40 MG capsule Take 40 mg by mouth daily at 12 noon.    Historical Provider, MD  hydrochlorothiazide (MICROZIDE) 12.5 MG capsule Take 12.5 mg by mouth daily.    Historical Provider, MD  quinapril (ACCUPRIL) 40 MG tablet Take 40 mg by mouth at bedtime.    Historical Provider, MD  simvastatin (ZOCOR) 80 MG tablet Take 80 mg by mouth daily.    Historical Provider, MD   BP 150/78 mmHg  Pulse 60  Temp(Src) 97.5 F (36.4 C) (Oral)  Resp 16  Wt 229 lb (103.874 kg)  SpO2 99% Physical Exam  Constitutional: He is oriented to person, place, and time. He appears well-developed and well-nourished.  HENT:  Head: Normocephalic.  Eyes: Pupils are equal, round, and reactive to light.  Neck: Normal range of motion.  Cardiovascular: Normal rate and regular rhythm.   Pulmonary/Chest: Effort normal and breath sounds normal.  Abdominal: Soft. Bowel sounds are normal. There is tenderness.  Peri-umbilical tenderness.  Musculoskeletal: He exhibits no edema or tenderness.  Lymphadenopathy:    He has no cervical adenopathy.  Neurological: He is alert and oriented to person, place, and time.  Skin: Skin is warm and dry.  Psychiatric: He has a normal mood and affect.  Nursing note and vitals reviewed.   ED Course  Procedures (including critical care time) Labs Review Labs Reviewed  CBC WITH DIFFERENTIAL  BASIC METABOLIC PANEL    Imaging Review No  results found.   EKG Interpretation None     Patient seen by PCP today for evaluation of abdominal pain.  History indicates patient with a hard protruding lump above umbilicus yesterday, spontaneously reduced.  Patient with chronic diffuse abdominal discomfort, no localized pain today. Abdominal discomfort today is fairly normal for patient. MDM   Final diagnoses:  Abdominal pain  CT results reviewed and shared with patient. Patient discussed with and seen by Dr. Tomi Bamberger. Ventral hernia, not incarcerated. Return precautions discussed. Follow-up  with [facility].    Norman Herrlich, NP 05/30/14 0355  Janice Norrie, MD 05/30/14 2249

## 2014-05-30 NOTE — Discharge Instructions (Signed)

## 2014-05-30 NOTE — ED Notes (Signed)
MD at bedside. 

## 2014-05-30 NOTE — ED Provider Notes (Signed)
Patient reports yesterday he had a large plum-sized swelling superior to his umbilicus that he's never had before. He states it was swollen all day and painful. He had nausea without vomiting. He states he was able to have a bowel movement. He states when he woke up this morning it was gone. He states he's never had it before. 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  Patient is alert and cooperative. His abdomen soft and when laying flat there are no masses felt or seen. When he strains I do feel a slight bulge superior to his umbilicus.  Medical screening examination/treatment/procedure(s) were conducted as a shared visit with non-physician practitioner(s) and myself.  I personally evaluated the patient during the encounter.   EKG Interpretation None       Rolland Porter, MD, Abram Sander   Janice Norrie, MD 05/30/14 1730

## 2014-06-08 DIAGNOSIS — G245 Blepharospasm: Secondary | ICD-10-CM | POA: Diagnosis not present

## 2014-06-14 ENCOUNTER — Other Ambulatory Visit (INDEPENDENT_AMBULATORY_CARE_PROVIDER_SITE_OTHER): Payer: Self-pay | Admitting: Surgery

## 2014-06-14 DIAGNOSIS — K429 Umbilical hernia without obstruction or gangrene: Secondary | ICD-10-CM | POA: Diagnosis not present

## 2014-06-14 NOTE — H&P (Signed)
John Novak 06/14/2014 9:30 AM Location: [facility] Patient #: [id number] DOB: 07/21/1945 Married / Language: English / Race: White Male History of Present Illness (Jushua Novak C. [name] MD; 06/14/2014 10:07 AM) Patient words: umb hernia.  The patient is a 68 year old male who presents with an umbilical hernia. Patient sent to me for concern of an umbilical hernia by Dr John Novak, [facility] Pleasant active male. Comes today with his wife. 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 He has noticed a small lump at his belly button for the past year. It was not really bothering him until an episode where he was very swollen like a size of a plum. Very painful. Eventually reduced down. Discussed with his primary care physician. Told to go to emergency room. Was reducible at the time. CT scan confirmed a hernia and no other abnormalities. Surgical consultation requested. Patient has transitioned to much lighter activity level but wants to be more active at some point. Denies any episodes of pain. No history of abdominal surgery. No skin infection. He does take a aspirin every other day. Claims he can walk 30 minutes without difficulty. No history of heart attack or stroke. Study Result CLINICAL DATA: Focal swelling in the periumbilical region with pain and nausea yesterday which has resolved in the interval from the prior day. EXAM: CT ABDOMEN AND PELVIS WITH CONTRAST TECHNIQUE: Multidetector CT imaging of the abdomen and pelvis was performed using the standard protocol following bolus administration of intravenous contrast. CONTRAST: 100mL OMNIPAQUE IOHEXOL 300 MG/ML SOLN COMPARISON: None. FINDINGS: Lung bases are free of acute infiltrate or sizable effusion. The liver, gallbladder, spleen, adrenal glands and pancreas are all normal in their CT appearance. The kidneys are well visualized bilaterally and reveal multiple cystic changes slightly  worse on the left than the right. No calculi or obstructive changes are seen. Diffuse aortoiliac calcifications are noted. The appendix is well visualized and within normal limits. Mild diverticular change is noted without diverticulitis. The bladder is well distended. No pelvic mass lesion is seen. No lymphadenopathy is noted. In the anterior abdominal wall there is a small umbilical hernia containing omental fat. Very mild inflammatory changes are noted. This likely corresponds to the previously described swelling in the umbilical region. No acute bony abnormality is noted. IMPRESSION: Umbilical hernia containing omental fat. This is likely the etiology of the swelling noted on the previous day by the patient. No incarceration is noted. Renal cystic change without acute abnormality. Diverticulosis without diverticulitis. Electronically Signed By: Mark Lukens M.D. On: 05/30/2014 18:29 Other Problems ([name], CMA; 06/14/2014 9:30 AM) Anxiety Disorder Arthritis Bladder Problems Diverticulosis Enlarged Prostate Gastroesophageal Reflux Disease Hemorrhoids High blood pressure Hypercholesterolemia Sleep Apnea  Past Surgical History ([name], CMA; 06/14/2014 9:30 AM) Colon Polyp Removal - Colonoscopy Knee Surgery Bilateral. Oral Surgery Shoulder Surgery Left. Spinal Surgery - Lower Back  Diagnostic Studies History ([name], CMA; 06/14/2014 9:30 AM) Colonoscopy within last year  Allergies ([name], CMA; 06/14/2014 9:31 AM) HYDROcodone Bitartrate *CHEMICALS* Latex Exam Gloves *MEDICAL DEVICES AND SUPPLIES*  Medication History ([name], CMA; 06/14/2014 9:32 AM) ClonazePAM (0.5MG Tablet, Oral) Active. LORazepam (0.5MG Tablet, Oral) Active. Zolpidem Tartrate (10MG Tablet, Oral) Active. Allopurinol (300MG Tablet, Oral) Active. Dexilant (60MG Capsule DR, Oral) Active. Lisinopril (40MG Tablet, Oral) Active. Olopatadine HCl (0.6% Solution,  Nasal) Active. Simvastatin (80MG Tablet, Oral) Active. Tamsulosin HCl (0.4MG Capsule, Oral) Active.  Social History ([name], CMA; 06/14/2014 9:30 AM) Caffeine use   Coffee, Tea. Tobacco use Never smoker.  Family History ([name], CMA; 06/14/2014 9:30 AM) Alcohol Abuse Father. Breast Cancer Sister. Diabetes Mellitus Mother, Son. Heart disease in male family member before age 65     Review of Systems ([name] CMA; 06/14/2014 9:30 AM) General Not Present- Appetite Loss, Chills, Fatigue, Fever, Night Sweats, Weight Gain and Weight Loss. Skin Present- Dryness. Not Present- Change in Wart/Mole, Hives, Jaundice, New Lesions, Non-Healing Wounds, Rash and Ulcer. HEENT Present- Seasonal Allergies, Visual Disturbances and Wears glasses/contact lenses. Not Present- Earache, Hearing Loss, Hoarseness, Nose Bleed, Oral Ulcers, Ringing in the Ears, Sinus Pain, Sore Throat and Yellow Eyes. Gastrointestinal Present- Bloating and Hemorrhoids. Not Present- Abdominal Pain, Bloody Stool, Change in Bowel Habits, Chronic diarrhea, Constipation, Difficulty Swallowing, Excessive gas, Gets full quickly at meals, Indigestion, Nausea, Rectal Pain and Vomiting. Musculoskeletal Present- Back Pain. Not Present- Joint Pain, Joint Stiffness, Muscle Pain, Muscle Weakness and Swelling of Extremities. Neurological Present- Decreased Memory. Not Present- Fainting, Headaches, Numbness, Seizures, Tingling, Tremor, Trouble walking and Weakness.  Vitals ([name] CMA; 06/14/2014 9:31 AM) 06/14/2014 9:30 AM Weight: 230 lb Height: 71in Body Surface Area: 2.29 m Body Mass Index: 32.08 kg/m Temp.: 98F(Temporal)  Pulse: 76 (Regular)  BP: 124/74 (Sitting, Left Arm, Standard)     Physical Exam (Daiton Cowles C. Aaryana Betke MD; 06/14/2014 10:03 AM)  General Mental Status-Alert. General Appearance-Not in acute distress, Not Sickly. Orientation-Oriented X3. Hydration-Well  hydrated. Voice-Normal.  Integumentary Global Assessment Upon inspection and palpation of skin surfaces of the - Axillae: non-tender, no inflammation or ulceration, no drainage. and Distribution of scalp and body hair is normal. General Characteristics Temperature - normal warmth is noted.  Head and Neck Head-normocephalic, atraumatic with no lesions or palpable masses. Face Global Assessment - atraumatic, no absence of expression. Neck Global Assessment - no abnormal movements, no bruit auscultated on the right, no bruit auscultated on the left, no decreased range of motion, non-tender. Trachea-midline. Thyroid Gland Characteristics - non-tender.  Eye Eyeball - Left-Extraocular movements intact, No Nystagmus. Eyeball - Right-Extraocular movements intact, No Nystagmus. Cornea - Left-No Hazy. Cornea - Right-No Hazy. Sclera/Conjunctiva - Left-No scleral icterus, No Discharge. Sclera/Conjunctiva - Right-No scleral icterus, No Discharge. Pupil - Left-Direct reaction to light normal. Pupil - Right-Direct reaction to light normal.  ENMT Ears Pinna - Left - no drainage observed, no generalized tenderness observed. Right - no drainage observed, no generalized tenderness observed. Nose and Sinuses External Inspection of the Nose - no destructive lesion observed. Inspection of the nares - Left - quiet respiration. Right - quiet respiration. Mouth and Throat Lips - Upper Lip - no fissures observed, no pallor noted. Lower Lip - no fissures observed, no pallor noted. Nasopharynx - no discharge present. Oral Cavity/Oropharynx - Tongue - no dryness observed. Oral Mucosa - no cyanosis observed. Hypopharynx - no evidence of airway distress observed.  Chest and Lung Exam Inspection Movements - Normal and Symmetrical. Accessory muscles - No use of accessory muscles in breathing. Palpation Palpation of the chest reveals - Non-tender. Auscultation Breath sounds - Normal  and Clear.  Cardiovascular Auscultation Rhythm - Regular. Murmurs & Other Heart Sounds - Auscultation of the heart reveals - No Murmurs and No Systolic Clicks.  Abdomen Inspection Inspection of the abdomen reveals - No Visible peristalsis and No Abnormal pulsations. Umbilicus - No Bleeding, No Urine drainage. Palpation/Percussion Palpation and Percussion of the abdomen reveal - Soft, Non Tender, No Rebound tenderness, No Rigidity (guarding) and No Cutaneous hyperesthesia. Note: Obese but soft. Mild wire   diastases recti.  Obvious umbilical hernia. 2 cm. Soft and reducible.   Male Genitourinary Sexual Maturity Tanner 5 - Adult hair pattern and Adult penile size and shape. Note: No obvious inguinal hernias. Testes and spermatic cord normal.   Rectal - Did not examine.  Peripheral Vascular Upper Extremity Inspection - Left - No Cyanotic nailbeds, Not Ischemic. Right - No Cyanotic nailbeds, Not Ischemic.  Neurologic Neurologic evaluation reveals -normal attention span and ability to concentrate, able to name objects and repeat phrases. Appropriate fund of knowledge , normal sensation and normal coordination. Mental Status Affect - not angry, not paranoid. Cranial Nerves-Normal Bilaterally. Gait-Normal.  Neuropsychiatric Mental status exam performed with findings of-able to articulate well with normal speech/language, rate, volume and coherence, thought content normal with ability to perform basic computations and apply abstract reasoning and no evidence of hallucinations, delusions, obsessions or homicidal/suicidal ideation.  Musculoskeletal Global Assessment Spine, Ribs and Pelvis - no instability, subluxation or laxity. Right Upper Extremity - no instability, subluxation or laxity.  Lymphatic Head & Neck  General Head & Neck Lymphatics: Bilateral - Description - No Localized lymphadenopathy. Axillary  General Axillary Region: Bilateral - Description - No  Localized lymphadenopathy. Femoral & Inguinal  Generalized Femoral & Inguinal Lymphatics: Left - Description - No Localized lymphadenopathy. Right - Description - No Localized lymphadenopathy.    Assessment & Plan (Meleane Selinger C. Maeryn Mcgath MD; 06/14/2014 10:05 AM)  UMBILICAL HERNIA WITHOUT OBSTRUCTION AND WITHOUT GANGRENE (553.1  K42.9) Impression: Small umbilical hernia by with 1 significant episode of incarceration. I think he would benefit from surgical repair. He and especially is wife are interested as well. He is hoping to wait until next month. She is wondering it done as soon as possible. Because he has had no further episodes and has transitioned to lower intensity of his work, I think it is okay to wait a month as long as he has no more episodes of incarceration. Discussed with the patient has wife. Given his activity level and obesity, would do underlay repair with mesh. He is interested in having a more durable repair as well:  Current Plans Schedule for Surgery Pt Education - [facility] Hernia Post-Op HCI (Travus Oren): discussed with patient and provided information. Pt Education - [facility] Pain Control (Zyshonne Malecha) Pt Education - [facility] Good Bowel Health (Amara Justen) The anatomy & physiology of the abdominal wall was discussed. The pathophysiology of hernias was discussed. Natural history risks without surgery including progeressive enlargement, pain, incarceration, & strangulation was discussed. Contributors to complications such as smoking, obesity, diabetes, prior surgery, etc were discussed.  I feel the risks of no intervention will lead to serious problems that outweigh the operative risks; therefore, I recommended surgery to reduce and repair the hernia. I explained laparoscopic techniques with possible need for an open approach. I noted the probable use of mesh to patch and/or buttress the hernia repair  Risks such as bleeding, infection, abscess, need for further treatment, heart attack, death, and other risks  were discussed. I noted a good likelihood this will help address the problem. Goals of post-operative recovery were discussed as well. Possibility that this will not correct all symptoms was explained. I stressed the importance of low-impact activity, aggressive pain control, avoiding constipation, & not pushing through pain to minimize risk of post-operative chronic pain or injury. Possibility of reherniation especially with smoking, obesity, diabetes, immunosuppression, and other health conditions was discussed. We will work to minimize complications.  An educational handout further explaining the pathology & treatment options was given   as well. Questions were answered. The patient expresses understanding & wishes to proceed with surgery.  Donyetta Ogletree C. Kresha Abelson, M.D., F.A.JohnS. Gastrointestinal and Minimally Invasive Surgery [facility] [address], [address] [place], [place] [address] [phone] Main / Paging   

## 2014-06-21 ENCOUNTER — Encounter (HOSPITAL_COMMUNITY): Payer: Self-pay

## 2014-06-21 ENCOUNTER — Encounter (HOSPITAL_COMMUNITY)
Admission: RE | Admit: 2014-06-21 | Discharge: 2014-06-21 | Disposition: A | Discharge status: Home / Self Care | Payer: Medicare Other | Source: Ambulatory Visit | Attending: Surgery | Admitting: Surgery

## 2014-06-21 DIAGNOSIS — G4730 Sleep apnea, unspecified: Secondary | ICD-10-CM | POA: Diagnosis not present

## 2014-06-21 DIAGNOSIS — K429 Umbilical hernia without obstruction or gangrene: Secondary | ICD-10-CM | POA: Diagnosis not present

## 2014-06-21 DIAGNOSIS — E780 Pure hypercholesterolemia: Secondary | ICD-10-CM | POA: Diagnosis not present

## 2014-06-21 DIAGNOSIS — I10 Essential (primary) hypertension: Secondary | ICD-10-CM | POA: Diagnosis not present

## 2014-06-21 DIAGNOSIS — K219 Gastro-esophageal reflux disease without esophagitis: Secondary | ICD-10-CM | POA: Diagnosis not present

## 2014-06-21 DIAGNOSIS — K439 Ventral hernia without obstruction or gangrene: Secondary | ICD-10-CM | POA: Diagnosis not present

## 2014-06-21 DIAGNOSIS — Z6832 Body mass index (BMI) 32.0-32.9, adult: Secondary | ICD-10-CM | POA: Diagnosis not present

## 2014-06-21 DIAGNOSIS — M1990 Unspecified osteoarthritis, unspecified site: Secondary | ICD-10-CM | POA: Diagnosis not present

## 2014-06-21 DIAGNOSIS — K5790 Diverticulosis of intestine, part unspecified, without perforation or abscess without bleeding: Secondary | ICD-10-CM | POA: Diagnosis not present

## 2014-06-21 HISTORY — DX: Unspecified osteoarthritis, unspecified site: M19.90

## 2014-06-21 HISTORY — DX: Blepharospasm: G24.5

## 2014-06-21 HISTORY — DX: Diverticulosis of intestine, part unspecified, without perforation or abscess without bleeding: K57.90

## 2014-06-21 HISTORY — DX: Sleep apnea, unspecified: G47.30

## 2014-06-21 NOTE — Pre-Procedure Instructions (Addendum)
06-21-14 CBC/d, CMP 05-30-14 Epic. EKG([facility]) 03-02-14 Report with chart. 05-30-14 CT abd/pelvis with lung bases view- Epic. 07-01-14 0920 - no additional labs or CXR needed today per Dr. Delma Post.

## 2014-06-21 NOTE — Patient Instructions (Addendum)
[place]  06/21/2014   Your procedure is scheduled on:   06-23-2014 Friday  Enter through [facility] Entrance and follow signs to [facility]. Arrive at     1130   AM ..  Call this number if you have problems the morning of surgery: [phone]  Or Presurgical Testing [phone].   For Living Will and/or Health Care Power Attorney Forms: please provide copy for your medical record,may bring AM of surgery(Forms should be already notarized -we do not provide this service).(06-23-14 Yes/will provide  No information on AM of surgery if can locate).  Remember: Follow any bowel prep instructions per MD office. For Cpap use: Bring mask and tubing only.   Do not eat food/ or drink: After Midnight.  Exception: may have clear liquids:up to 6 Hours before arrival. Nothing after:0700 AM   Clear liquids include soda, tea, [place] coffee, apple or grape juice, broth.  Take these medicines the morning of surgery with A SIP OF WATER: Allopurinol. Lorazepam. Dexlansoprazole. Allegra. Use eye drops, Flonase as usual.    Do not wear jewelry, make-up or nail polish.  Do not wear deodorant, lotions, powders, or perfumes.   Do not shave legs and under arms- 48 hours(2 days) prior to first CHG shower.(Shaving face and neck okay.)  Do not bring valuables to the hospital.(Hospital is not responsible for lost valuables).  Contacts, dentures or removable bridgework, body piercing, hair pins may not be worn into surgery.  Leave suitcase in the car. After surgery it may be brought to your room.  For patients admitted to the hospital, checkout time is 11:00 AM the day of discharge.(Restricted visitors-Any Persons displaying flu-like symptoms or illness).    Patients discharged the day of surgery will not be allowed to drive home. Must have responsible person with you x 24 hours once discharged.  Name and phone number of your driver: Masahiro Iglesia, spouse [phone]     Please read over  the following fact sheets that you were given:  CHG(Chlorhexidine Gluconate 4% Surgical Soap) use.           [facility] - Preparing for Surgery Before surgery, you can play an important role.  Because skin is not sterile, your skin needs to be as free of germs as possible.  You can reduce the number of germs on your skin by washing with CHG (chlorahexidine gluconate) soap before surgery.  CHG is an antiseptic cleaner which kills germs and bonds with the skin to continue killing germs even after washing. Please DO NOT use if you have an allergy to CHG or antibacterial soaps.  If your skin becomes reddened/irritated stop using the CHG and inform your nurse when you arrive at Short Stay. Do not shave (including legs and underarms) for at least 48 hours prior to the first CHG shower.  You may shave your face/neck. Please follow these instructions carefully:  1.  Shower with CHG Soap the night before surgery and the  morning of Surgery.  2.  If you choose to wash your hair, wash your hair first as usual with your  normal  shampoo.  3.  After you shampoo, rinse your hair and body thoroughly to remove the  shampoo.                           4.  Use CHG as you would any other liquid soap.  You can apply chg directly  to the skin and wash                       Gently with a scrungie or clean washcloth.  5.  Apply the CHG Soap to your body ONLY FROM THE NECK DOWN.   Do not use on face/ open                           Wound or open sores. Avoid contact with eyes, ears mouth and genitals (private parts).                       Wash face,  Genitals (private parts) with your normal soap.             6.  Wash thoroughly, paying special attention to the area where your surgery  will be performed.  7.  Thoroughly rinse your body with warm water from the neck down.  8.  DO NOT shower/wash with your normal soap after using and rinsing off  the CHG Soap.                9.  Pat yourself dry with a clean towel.             10.  Wear clean pajamas.            11.  Place clean sheets on your bed the night of your first shower and do not  sleep with pets. Day of Surgery : Do not apply any lotions/deodorants the morning of surgery.  Please wear clean clothes to the hospital/surgery center.  FAILURE TO FOLLOW THESE INSTRUCTIONS MAY RESULT IN THE CANCELLATION OF YOUR SURGERY PATIENT SIGNATURE_________________________________  NURSE SIGNATURE__________________________________  ________________________________________________________________________

## 2014-06-22 DIAGNOSIS — G243 Spasmodic torticollis: Secondary | ICD-10-CM | POA: Diagnosis not present

## 2014-06-22 DIAGNOSIS — G244 Idiopathic orofacial dystonia: Secondary | ICD-10-CM | POA: Diagnosis not present

## 2014-06-22 MED ORDER — GENTAMICIN SULFATE 40 MG/ML IJ SOLN
520.0000 mg | INTRAVENOUS | Status: DC
  Filled 2014-06-22: qty 13

## 2014-06-22 MED ORDER — GENTAMICIN SULFATE 40 MG/ML IJ SOLN
520.0000 mg | Freq: Once | INTRAVENOUS | Status: AC
  Administered 2014-06-23: 520 mg via INTRAVENOUS
  Filled 2014-06-22: qty 13

## 2014-06-23 ENCOUNTER — Encounter (HOSPITAL_COMMUNITY)
Admission: RE | Disposition: A | Discharge status: Home / Self Care | Payer: Self-pay | Source: Ambulatory Visit | Attending: Surgery

## 2014-06-23 ENCOUNTER — Ambulatory Visit (HOSPITAL_COMMUNITY): Payer: Medicare Other | Admitting: Anesthesiology

## 2014-06-23 ENCOUNTER — Ambulatory Visit (HOSPITAL_COMMUNITY)
Admission: RE | Admit: 2014-06-23 | Discharge: 2014-06-23 | Disposition: A | Discharge status: Home / Self Care | Payer: Medicare Other | Source: Ambulatory Visit | Attending: Surgery | Admitting: Surgery

## 2014-06-23 ENCOUNTER — Encounter (HOSPITAL_COMMUNITY): Payer: Self-pay | Admitting: *Deleted

## 2014-06-23 DIAGNOSIS — M1990 Unspecified osteoarthritis, unspecified site: Secondary | ICD-10-CM | POA: Insufficient documentation

## 2014-06-23 DIAGNOSIS — E780 Pure hypercholesterolemia: Secondary | ICD-10-CM | POA: Insufficient documentation

## 2014-06-23 DIAGNOSIS — K429 Umbilical hernia without obstruction or gangrene: Secondary | ICD-10-CM | POA: Insufficient documentation

## 2014-06-23 DIAGNOSIS — K5790 Diverticulosis of intestine, part unspecified, without perforation or abscess without bleeding: Secondary | ICD-10-CM | POA: Insufficient documentation

## 2014-06-23 DIAGNOSIS — Z6832 Body mass index (BMI) 32.0-32.9, adult: Secondary | ICD-10-CM | POA: Insufficient documentation

## 2014-06-23 DIAGNOSIS — K219 Gastro-esophageal reflux disease without esophagitis: Secondary | ICD-10-CM | POA: Insufficient documentation

## 2014-06-23 DIAGNOSIS — K439 Ventral hernia without obstruction or gangrene: Secondary | ICD-10-CM

## 2014-06-23 DIAGNOSIS — G4730 Sleep apnea, unspecified: Secondary | ICD-10-CM | POA: Insufficient documentation

## 2014-06-23 DIAGNOSIS — I10 Essential (primary) hypertension: Secondary | ICD-10-CM | POA: Insufficient documentation

## 2014-06-23 HISTORY — PX: INSERTION OF MESH: SHX5868

## 2014-06-23 HISTORY — PX: VENTRAL HERNIA REPAIR: SHX424

## 2014-06-23 SURGERY — REPAIR, HERNIA, VENTRAL, LAPAROSCOPIC
Anesthesia: General | Site: Abdomen

## 2014-06-23 MED ORDER — GLYCOPYRROLATE 0.2 MG/ML IJ SOLN
INTRAMUSCULAR | Status: DC | PRN
  Administered 2014-06-23: .8 mg via INTRAVENOUS

## 2014-06-23 MED ORDER — LACTATED RINGERS IV SOLN
INTRAVENOUS | Status: DC
  Administered 2014-06-23: 1000 mL via INTRAVENOUS
  Administered 2014-06-23: 16:00:00 via INTRAVENOUS

## 2014-06-23 MED ORDER — PROMETHAZINE HCL 25 MG/ML IJ SOLN: 6.2500 mg | INTRAMUSCULAR | Status: DC | PRN

## 2014-06-23 MED ORDER — LIDOCAINE HCL (CARDIAC) 20 MG/ML IV SOLN
INTRAVENOUS | Status: AC
  Filled 2014-06-23: qty 5

## 2014-06-23 MED ORDER — ROCURONIUM BROMIDE 100 MG/10ML IV SOLN
INTRAVENOUS | Status: DC | PRN
  Administered 2014-06-23: 35 mg via INTRAVENOUS
  Administered 2014-06-23: 5 mg via INTRAVENOUS

## 2014-06-23 MED ORDER — FENTANYL CITRATE 0.05 MG/ML IJ SOLN
INTRAMUSCULAR | Status: AC
  Filled 2014-06-23: qty 5

## 2014-06-23 MED ORDER — DEXAMETHASONE SODIUM PHOSPHATE 10 MG/ML IJ SOLN
INTRAMUSCULAR | Status: DC | PRN
  Administered 2014-06-23: 10 mg via INTRAVENOUS

## 2014-06-23 MED ORDER — ONDANSETRON HCL 4 MG/2ML IJ SOLN
INTRAMUSCULAR | Status: DC | PRN
  Administered 2014-06-23: 4 mg via INTRAVENOUS

## 2014-06-23 MED ORDER — ROCURONIUM BROMIDE 100 MG/10ML IV SOLN
INTRAVENOUS | Status: AC
  Filled 2014-06-23: qty 1

## 2014-06-23 MED ORDER — ACETAMINOPHEN 10 MG/ML IV SOLN
1000.0000 mg | Freq: Once | INTRAVENOUS | Status: AC
  Administered 2014-06-23: 1000 mg via INTRAVENOUS

## 2014-06-23 MED ORDER — PROPOFOL 10 MG/ML IV BOLUS
INTRAVENOUS | Status: AC
  Filled 2014-06-23: qty 20

## 2014-06-23 MED ORDER — FENTANYL CITRATE 0.05 MG/ML IJ SOLN
INTRAMUSCULAR | Status: DC | PRN
  Administered 2014-06-23 (×3): 50 ug via INTRAVENOUS
  Administered 2014-06-23: 100 ug via INTRAVENOUS

## 2014-06-23 MED ORDER — 0.9 % SODIUM CHLORIDE (POUR BTL) OPTIME
TOPICAL | Status: DC | PRN
  Administered 2014-06-23: 1000 mL

## 2014-06-23 MED ORDER — FENTANYL CITRATE 0.05 MG/ML IJ SOLN: 25.0000 ug | INTRAMUSCULAR | Status: DC | PRN

## 2014-06-23 MED ORDER — SUCCINYLCHOLINE CHLORIDE 20 MG/ML IJ SOLN
INTRAMUSCULAR | Status: DC | PRN
  Administered 2014-06-23: 140 mg via INTRAVENOUS

## 2014-06-23 MED ORDER — NEOSTIGMINE METHYLSULFATE 10 MG/10ML IV SOLN
INTRAVENOUS | Status: DC | PRN
  Administered 2014-06-23: 5 mg via INTRAVENOUS

## 2014-06-23 MED ORDER — LIDOCAINE HCL (CARDIAC) 20 MG/ML IV SOLN
INTRAVENOUS | Status: DC | PRN
  Administered 2014-06-23: 50 mg via INTRAVENOUS

## 2014-06-23 MED ORDER — CHLORHEXIDINE GLUCONATE 4 % EX LIQD: 1.0000 "application " | Freq: Once | CUTANEOUS | Status: DC

## 2014-06-23 MED ORDER — CLINDAMYCIN PHOSPHATE 900 MG/50ML IV SOLN
INTRAVENOUS | Status: AC
  Filled 2014-06-23: qty 50

## 2014-06-23 MED ORDER — CLINDAMYCIN PHOSPHATE 900 MG/50ML IV SOLN
900.0000 mg | INTRAVENOUS | Status: AC
  Administered 2014-06-23: 900 mg via INTRAVENOUS

## 2014-06-23 MED ORDER — EPHEDRINE SULFATE 50 MG/ML IJ SOLN
INTRAMUSCULAR | Status: DC | PRN
  Administered 2014-06-23 (×2): 10 mg via INTRAVENOUS

## 2014-06-23 MED ORDER — MIDAZOLAM HCL 5 MG/5ML IJ SOLN
INTRAMUSCULAR | Status: DC | PRN
  Administered 2014-06-23: 2 mg via INTRAVENOUS

## 2014-06-23 MED ORDER — OXYCODONE HCL 5 MG PO TABS: 5.0000 mg | ORAL_TABLET | ORAL | Status: DC | PRN

## 2014-06-23 MED ORDER — MEPERIDINE HCL 50 MG/ML IJ SOLN: 6.2500 mg | INTRAMUSCULAR | Status: DC | PRN

## 2014-06-23 MED ORDER — MIDAZOLAM HCL 2 MG/2ML IJ SOLN
INTRAMUSCULAR | Status: AC
  Filled 2014-06-23: qty 2

## 2014-06-23 MED ORDER — BUPIVACAINE-EPINEPHRINE 0.25% -1:200000 IJ SOLN
INTRAMUSCULAR | Status: DC | PRN
  Administered 2014-06-23: 90 mL

## 2014-06-23 MED ORDER — PROPOFOL 10 MG/ML IV BOLUS
INTRAVENOUS | Status: DC | PRN
  Administered 2014-06-23: 200 mg via INTRAVENOUS

## 2014-06-23 MED ORDER — NEOSTIGMINE METHYLSULFATE 10 MG/10ML IV SOLN
INTRAVENOUS | Status: AC
  Filled 2014-06-23: qty 1

## 2014-06-23 MED ORDER — BUPIVACAINE-EPINEPHRINE 0.25% -1:200000 IJ SOLN
INTRAMUSCULAR | Status: AC
  Filled 2014-06-23: qty 2

## 2014-06-23 MED ORDER — LACTATED RINGERS IR SOLN
Status: DC | PRN
  Administered 2014-06-23: 1000 mL

## 2014-06-23 MED ORDER — GLYCOPYRROLATE 0.2 MG/ML IJ SOLN
INTRAMUSCULAR | Status: AC
Start: 2014-06-23 — End: 2014-06-23
  Filled 2014-06-23: qty 4

## 2014-06-23 SURGICAL SUPPLY — 39 items
APPLIER CLIP 5 13 M/L LIGAMAX5 (MISCELLANEOUS)
BINDER ABDOMINAL 12 ML 46-62 (SOFTGOODS) ×4 IMPLANT
CABLE HI FREQUENCY MONOPOLAR (ELECTROSURGICAL) IMPLANT
CATH KIT ON-Q SILVERSOAK 7.5IN (CATHETERS) IMPLANT
CHLORAPREP W/TINT 26ML (MISCELLANEOUS) ×4 IMPLANT
CLIP APPLIE 5 13 M/L LIGAMAX5 (MISCELLANEOUS) IMPLANT
CLOSURE WOUND 1/2 X4 (GAUZE/BANDAGES/DRESSINGS) ×2
DECANTER SPIKE VIAL GLASS SM (MISCELLANEOUS) ×4 IMPLANT
DEVICE SECURE STRAP 25 ABSORB (INSTRUMENTS) ×4 IMPLANT
DEVICE TROCAR PUNCTURE CLOSURE (ENDOMECHANICALS) ×4 IMPLANT
DRAPE LAPAROSCOPIC ABDOMINAL (DRAPES) ×4 IMPLANT
DRAPE WARM FLUID 44X44 (DRAPE) ×4 IMPLANT
DRSG TEGADERM 2-3/8X2-3/4 SM (GAUZE/BANDAGES/DRESSINGS) ×8 IMPLANT
DRSG TEGADERM 4X4.75 (GAUZE/BANDAGES/DRESSINGS) ×4 IMPLANT
ELECT REM PT RETURN 9FT ADLT (ELECTROSURGICAL) ×4
ELECTRODE REM PT RTRN 9FT ADLT (ELECTROSURGICAL) ×2 IMPLANT
GAUZE SPONGE 2X2 8PLY STRL LF (GAUZE/BANDAGES/DRESSINGS) ×2 IMPLANT
GLOVE ECLIPSE 8.0 STRL XLNG CF (GLOVE) IMPLANT
GLOVE INDICATOR 8.0 STRL GRN (GLOVE) ×4 IMPLANT
GOWN STRL REUS W/TWL XL LVL3 (GOWN DISPOSABLE) ×8 IMPLANT
KIT BASIN OR (CUSTOM PROCEDURE TRAY) ×4 IMPLANT
MESH VENTRALIGHT ST 6IN CRC (Mesh General) ×4 IMPLANT
NEEDLE SPNL 22GX3.5 QUINCKE BK (NEEDLE) IMPLANT
PEN SKIN MARKING BROAD (MISCELLANEOUS) ×4 IMPLANT
SCISSORS LAP 5X35 DISP (ENDOMECHANICALS) IMPLANT
SET IRRIG TUBING LAPAROSCOPIC (IRRIGATION / IRRIGATOR) ×4 IMPLANT
SHEARS HARMONIC ACE PLUS 36CM (ENDOMECHANICALS) IMPLANT
SLEEVE XCEL OPT CAN 5 100 (ENDOMECHANICALS) ×8 IMPLANT
SPONGE GAUZE 2X2 STER 10/PKG (GAUZE/BANDAGES/DRESSINGS) ×2
STRIP CLOSURE SKIN 1/2X4 (GAUZE/BANDAGES/DRESSINGS) ×6 IMPLANT
SUT MNCRL AB 4-0 PS2 18 (SUTURE) ×4 IMPLANT
SUT PDS AB 1 CT1 27 (SUTURE) ×8 IMPLANT
SUT PROLENE 1 CT 1 30 (SUTURE) ×24 IMPLANT
TOWEL OR 17X26 10 PK STRL BLUE (TOWEL DISPOSABLE) ×4 IMPLANT
TRAY FOLEY CATH 14FRSI W/METER (CATHETERS) IMPLANT
TRAY LAPAROSCOPIC (CUSTOM PROCEDURE TRAY) ×4 IMPLANT
TROCAR BLADELESS OPT 5 100 (ENDOMECHANICALS) ×4 IMPLANT
TROCAR XCEL NON-BLD 11X100MML (ENDOMECHANICALS) IMPLANT
TUNNELER SHEATH ON-Q 16GX12 DP (PAIN MANAGEMENT) IMPLANT

## 2014-06-23 NOTE — Interval H&P Note (Signed)
History and Physical Interval Note:  06/23/2014 1:46 PM  John Novak  has presented today for surgery, with the diagnosis of Umbilical Hernia  The various methods of treatment have been discussed with the patient and family. After consideration of risks, benefits and other options for treatment, the patient has consented to  Procedure(s): [place] (N/A) INSERTION OF MESH (N/A) as a surgical intervention .  The patient's history has been reviewed, patient examined, no change in status, stable for surgery.  I have reviewed the patient's chart and labs.  Questions were answered to the patient's satisfaction.     Taunja Brickner C.

## 2014-06-23 NOTE — Discharge Instructions (Signed)
[facility]ERNIA REPAIR: POST OP INSTRUCTIONS ° °1. DIET: Follow a light bland diet the first 24 hours after arrival home, such as soup, liquids, crackers, etc.  Be sure to include lots of fluids daily.  Avoid fast food or heavy meals as your are more likely to get nauseated.  Eat a low fat the next few days after surgery. °2. Take your usually prescribed home medications unless otherwise directed. °3. PAIN CONTROL: °a. Pain is best controlled by a usual combination of three different methods TOGETHER: °i. Ice/Heat °ii. Over the counter pain medication °iii. Prescription pain medication °b. Most patients will experience some swelling and bruising around the hernia(s) such as the bellybutton, groins, or old incisions.  Ice packs or heating pads (30-60 minutes up to 6 times a day) will help. Use ice for the first few days to help decrease swelling and bruising, then switch to heat to help relax tight/sore spots and speed recovery.  Some people prefer to use ice alone, heat alone, alternating between ice & heat.  Experiment to what works for you.  Swelling and bruising can take several weeks to resolve.   °c. It is helpful to take an over-the-counter pain medication regularly for the first few weeks.  Choose one of the following that works best for you: °i. Naproxen (Aleve, etc)  Two 220mg tabs twice a day °ii. Ibuprofen (Advil, etc) Three 200mg tabs four times a day (every meal & bedtime) °iii. Acetaminophen (Tylenol, etc) 325-650mg four times a day (every meal & bedtime) °d. A  prescription for pain medication should be given to you upon discharge.  Take your pain medication as prescribed.  °i. If you are having problems/concerns with the prescription medicine (does not control pain, nausea, vomiting, rash, itching, etc), please call us [phone] to see if we need to switch you to a different pain medicine that will work better for you and/or control your side effect better. °ii. If you need a refill on your pain  medication, please contact your pharmacy.  They will contact our office to request authorization. Prescriptions will not be filled after 5 pm or on week-ends. °4. Avoid getting constipated.  Between the surgery and the pain medications, it is common to experience some constipation.  Increasing fluid intake and taking a fiber supplement (such as Metamucil, Citrucel, FiberCon, MiraLax, etc) 1-2 times a day regularly will usually help prevent this problem from occurring.  A mild laxative (prune juice, Milk of Magnesia, MiraLax, etc) should be taken according to package directions if there are no bowel movements after 48 hours.   °5. Wash / shower every day.  You may shower over the dressings as they are waterproof.   °6. Remove your waterproof bandages 5 days after surgery.  You may leave the incision open to air.  You may replace a dressing/Band-Aid to cover the incision for comfort if you wish.  Continue to shower over incision(s) after the dressing is off. ° ° ° °7. ACTIVITIES as tolerated:   °a. You may resume regular (light) daily activities beginning the next day--such as daily self-care, walking, climbing stairs--gradually increasing activities as tolerated.  If you can walk 30 minutes without difficulty, it is safe to try more intense activity such as jogging, treadmill, bicycling, low-impact aerobics, swimming, etc. °b. Save the most intensive and strenuous activity for last such as sit-ups, heavy lifting, contact sports, etc  Refrain from any heavy lifting or straining until you are off narcotics for pain control.   °  c. DO NOT PUSH THROUGH PAIN.  Let pain be your guide: If it hurts to do something, don't do it.  Pain is your body warning you to avoid that activity for another week until the pain goes down. d. You may drive when you are no longer taking prescription pain medication, you can comfortably wear a seatbelt, and you can safely maneuver your car and apply brakes. e. Dennis Bast may have sexual intercourse  when it is comfortable.  8. FOLLOW UP in our office a. Please call [facility] at [phone] to set up an appointment to see your surgeon in the office for a follow-up appointment approximately 2-3 weeks after your surgery. b. Make sure that you call for this appointment the day you arrive home to insure a convenient appointment time. 9.  IF YOU HAVE DISABILITY OR FAMILY LEAVE FORMS, BRING THEM TO THE OFFICE FOR PROCESSING.  DO NOT GIVE THEM TO YOUR DOCTOR.  WHEN TO CALL us [phone]: 1. Poor pain control 2. Reactions / problems with new medications (rash/itching, nausea, etc)  3. Fever over 101.5 F (38.5 C) 4. Inability to urinate 5. Nausea and/or vomiting 6. Worsening swelling or bruising 7. Continued bleeding from incision. 8. Increased pain, redness, or drainage from the incision   The clinic staff is available to answer your questions during regular business hours (8:30am-5pm).  Please dont hesitate to call and ask to speak to one of our nurses for clinical concerns.   If you have a medical emergency, go to the nearest emergency room or call 911.  A surgeon from [facility] is always on call at the hospitals in [facility], [place], [place], [place], [place]  [address] ?  [address] [address], [place], [place]   [address] MAIN: [phone] ? TOLL FREE: [phone] ? FAX: [phone] [email or web address]  Managing Pain  Pain after surgery or related to activity is often due to strain/injury to muscle, tendon, nerves and/or incisions.  This pain is usually short-term and will improve in a few months.   Many people find it helpful to do the following things TOGETHER to help speed the process of healing and to get back to regular activity more quickly:  1. Avoid heavy physical activity a.  no lifting greater than 20 pounds b. Do not push through the pain.  Listen to your body and avoid positions and maneuvers than  reproduce the pain c. Walking is okay as tolerated, but go slowly and stop when getting sore.  d. Remember: If it hurts to do it, then dont do it! 2. Take Anti-inflammatory medication  a. Take with food/snack around the clock for 1-2 weeks i. This helps the muscle and nerve tissues become less irritable and calm down faster b. Choose ONE of the following over-the-counter medications: i. Naproxen 252m tabs (ex. Aleve) 1-2 pills twice a day  ii. Ibuprofen 2072mtabs (ex. Advil, Motrin) 3-4 pills with every meal and just before bedtime iii. Acetaminophen 50062mabs (Tylenol) 1-2 pills with every meal and just before bedtime 3. Use a Heating pad or Ice/Cold Pack a. 4-6 times a day b. May use warm bath/hottub  or showers 4. Try Gentle Massage and/or Stretching  a. at the area of pain many times a day b. stop if you feel pain - do not overdo it  Try these steps together to help you body heal faster and avoid making things get worse.  Doing just one of these  things may not be enough.    If you are not getting better after two weeks or are noticing you are getting worse, contact our office for further advice; we may need to re-evaluate you & see what other things we can do to help.  GETTING TO GOOD BOWEL HEALTH. Irregular bowel habits such as constipation and diarrhea can lead to many problems over time.  Having one soft bowel movement a day is the most important way to prevent further problems.  The anorectal canal is designed to handle stretching and feces to safely manage our ability to get rid of solid waste (feces, poop, stool) out of our body.  BUT, hard constipated stools can act like ripping concrete bricks and diarrhea can be a burning fire to this very sensitive area of our body, causing inflamed hemorrhoids, anal fissures, increasing risk is perirectal abscesses, abdominal pain/bloating, an making irritable bowel worse.     The goal: ONE SOFT BOWEL MOVEMENT A DAY!  To have soft, regular  bowel movements:   Drink at least 8 tall glasses of water a day.    Take plenty of fiber.  Fiber is the undigested part of plant food that passes into the colon, acting s natures broom to encourage bowel motility and movement.  Fiber can absorb and hold large amounts of water. This results in a larger, bulkier stool, which is soft and easier to pass. Work gradually over several weeks up to 6 servings a day of fiber (25g a day even more if needed) in the form of: o Vegetables -- Root (potatoes, carrots, turnips), leafy green (lettuce, salad greens, celery, spinach), or cooked high residue (cabbage, broccoli, etc) o Fruit -- Fresh (unpeeled skin & pulp), Dried (prunes, apricots, cherries, etc ),  or stewed ( applesauce)  o Whole grain breads, pasta, etc (whole wheat)  o Bran cereals   Bulking Agents -- This type of water-retaining fiber generally is easily obtained each day by one of the following:  o Psyllium bran -- The psyllium plant is remarkable because its ground seeds can retain so much water. This product is available as Metamucil, Konsyl, Effersyllium, Per Diem Fiber, or the less expensive generic preparation in drug and health food stores. Although labeled a laxative, it really is not a laxative.  o Methylcellulose -- This is another fiber derived from wood which also retains water. It is available as Citrucel. o Polyethylene Glycol - and artificial fiber commonly called Miralax or Glycolax.  It is helpful for people with gassy or bloated feelings with regular fiber o Flax Seed - a less gassy fiber than psyllium  No reading or other relaxing activity while on the toilet. If bowel movements take longer than 5 minutes, you are too constipated  AVOID CONSTIPATION.  High fiber and water intake usually takes care of this.  Sometimes a laxative is needed to stimulate more frequent bowel movements, but   Laxatives are not a good long-term solution as it can wear the colon out. o Osmotics  (Milk of Magnesia, Fleets phosphosoda, Magnesium citrate, MiraLax, GoLytely) are safer than  o Stimulants (Senokot, Castor Oil, Dulcolax, Ex Lax)    o Do not take laxatives for more than 7days in a row.   IF SEVERELY CONSTIPATED, try a Bowel Retraining Program: o Do not use laxatives.  o Eat a diet high in roughage, such as bran cereals and leafy vegetables.  o Drink six (6) ounces of prune or apricot juice each morning.  o Eat  two (2) large servings of stewed fruit each day.  o Take one (1) heaping tablespoon of a psyllium-based bulking agent twice a day. Use sugar-free sweetener when possible to avoid excessive calories.  o Eat a normal breakfast.  o Set aside 15 minutes after breakfast to sit on the toilet, but do not strain to have a bowel movement.  o If you do not have a bowel movement by the third day, use an enema and repeat the above steps.   Controlling diarrhea o Switch to liquids and simpler foods for a few days to avoid stressing your intestines further. o Avoid dairy products (especially milk & ice cream) for a short time.  The intestines often can lose the ability to digest lactose when stressed. o Avoid foods that cause gassiness or bloating.  Typical foods include beans and other legumes, cabbage, broccoli, and dairy foods.  Every person has some sensitivity to other foods, so listen to our body and avoid those foods that trigger problems for you. o Adding fiber (Citrucel, Metamucil, psyllium, Miralax) gradually can help thicken stools by absorbing excess fluid and retrain the intestines to act more normally.  Slowly increase the dose over a few weeks.  Too much fiber too soon can backfire and cause cramping & bloating. o Probiotics (such as active yogurt, Align, etc) may help repopulate the intestines and colon with normal bacteria and calm down a sensitive digestive tract.  Most studies show it to be of mild help, though, and such products can be costly. o Medicines: - Bismuth  subsalicylate (ex. Kayopectate, Pepto Bismol) every 30 minutes for up to 6 doses can help control diarrhea.  Avoid if pregnant. - Loperamide (Immodium) can slow down diarrhea.  Start with two tablets (4mg  total) first and then try one tablet every 6 hours.  Avoid if you are having fevers or severe pain.  If you are not better or start feeling worse, stop all medicines and call your doctor for advice o Call your doctor if you are getting worse or not better.  Sometimes further testing (cultures, endoscopy, X-ray studies, bloodwork, etc) may be needed to help diagnose and treat the cause of the diarrhea.        General Anesthesia, Care After Refer to this sheet in the next few weeks. These instructions provide you with information on caring for yourself after your procedure. Your health care provider may also give you more specific instructions. Your treatment has been planned according to current medical practices, but problems sometimes occur. Call your health care provider if you have any problems or questions after your procedure. WHAT TO EXPECT AFTER THE PROCEDURE After the procedure, it is typical to experience: Sleepiness. Nausea and vomiting. HOME CARE INSTRUCTIONS For the first 24 hours after general anesthesia: Have a responsible person with you. Do not drive a car. If you are alone, do not take public transportation. Do not drink alcohol. Do not take medicine that has not been prescribed by your health care provider. Do not sign important papers or make important decisions. You may resume a normal diet and activities as directed by your health care provider. Change bandages (dressings) as directed. If you have questions or problems that seem related to general anesthesia, call the hospital and ask for the anesthetist or anesthesiologist on call. SEEK MEDICAL CARE IF: You have nausea and vomiting that continue the day after anesthesia. You develop a rash. SEEK IMMEDIATE MEDICAL  CARE IF:  You have difficulty breathing. You have  chest pain. You have any allergic problems. Document Released: 09/29/2000 Document Revised: 06/28/2013 Document Reviewed: 01/06/2013 [facility] Patient Information 2015 [place], [place]. This information is not intended to replace advice given to you by your health care provider. Make sure you discuss any questions you have with your health care provider.

## 2014-06-23 NOTE — Op Note (Signed)
06/23/2014  4:25 PM  PATIENT:  John Novak  68 y.o. male  Patient Care Team: Cari Caraway, MD as PCP - General (Family Medicine)  PRE-OPERATIVE DIAGNOSIS:  Umbilical Hernia  POST-OPERATIVE DIAGNOSIS:  Umbilical Hernia with prior incarceration & diastasis recti  PROCEDURE:  Procedure(s): LAPAROSCOPIC VENTRAL WALL HERNIA REPAIR INSERTION OF MESH  SURGEON:  Surgeon(s): Michael Boston, MD  ASSISTANT: RN   ANESTHESIA:   local and general  EBL:  Total I/O In: 1000 [I.V.:1000] Out: -   Delay start of Pharmacological VTE agent (>24hrs) due to surgical blood loss or risk of bleeding:  not applicable  DRAINS: none   SPECIMEN:  No Specimen  DISPOSITION OF SPECIMEN:  N/A  COUNTS:  YES  PLAN OF CARE: Discharge to home after PACU  PATIENT DISPOSITION:  PACU - hemodynamically stable.  INDICATION: Pleasant patient has developed a ventral wall abdominal hernia.   Recommendation was made for surgical repair:  The anatomy & physiology of the abdominal wall was discussed. The pathophysiology of hernias was discussed. Natural history risks without surgery including progeressive enlargement, pain, incarceration & strangulation was discussed. Contributors to complications such as smoking, obesity, diabetes, prior surgery, etc were discussed.  I feel the risks of no intervention will lead to serious problems that outweigh the operative risks; therefore, I recommended surgery to reduce and repair the hernia. I explained laparoscopic techniques with possible need for an open approach. I noted the probable use of mesh to patch and/or buttress the hernia repair  Risks such as bleeding, infection, abscess, need for further treatment, heart attack, death, and other risks were discussed. I noted a good likelihood this will help address the problem. Goals of post-operative recovery were discussed as well. Possibility that this will not correct all symptoms was explained. I stressed the importance  of low-impact activity, aggressive pain control, avoiding constipation, & not pushing through pain to minimize risk of post-operative chronic pain or injury. Possibility of reherniation especially with smoking, obesity, diabetes, immunosuppression, and other health conditions was discussed. We will work to minimize complications.  An educational handout further explaining the pathology & treatment options was given as well. Questions were answered. The patient expresses understanding & wishes to proceed with surgery.   OR FINDINGS: 3 by 2.5 cm umbilical VWh   Type of repair - Laparoscopic underlay repair   Name of mesh - Bard Ventralight dual sided (polypropylene / Seprafilm)  Size of mesh - Length 15 cm, Width 15 cm  Mesh overlap - 5-6 cm  Placement of mesh - Intraperitoneal underlay repair   DESCRIPTION:   Informed consent was confirmed. The patient underwent general anaesthesia without difficulty. The patient was positioned appropriately. VTE prevention in place. The patient's abdomen was clipped, prepped, & draped in a sterile fashion. Surgical timeout confirmed our plan.  The patient was positioned in reverse Trendelenburg. Abdominal entry was gained using optical entry technique in the left upper abdomen. Entry was clean. I induced carbon dioxide insufflation. Camera inspection revealed no injury. Extra ports were carefully placed under direct laparoscopic visualization.   I could see the hernia in the periumbilical abdomen.  There is no reincarceration.  There were no adhesions to the anterior abdominal wall.  I mapped out the region using a needle passer.   Because of his morbid obesity and the size greater than 2 cm, I felt mesh reinforcement/repair was needed to minimize recurrence.  To ensure that I would have at least 5 cm radial coverage outside of the hernia  defect, I chose a 15x15 cm dual sided mesh.  I placed #1 Prolene stitches around its edge about every 5 cm = 10 total.  I  rolled the mesh & placed into the peritoneal cavity through the 10 cm fascial defect.  I unrolled  the mesh and positioned it appropriately.  I secured the mesh to cover up the hernia defect using a laparoscopic suture passer to pass the tails of the Prolene through the abdominal wall & tagged them with clamps.  I started out in four corners to make sure I had the mesh centered over the hernia defect appropriately, and then proceeded to work in quadrants.  We evacuated CO2 & desufflated the abdomen.  I tied the fascial stitches down.  I reinsufflated the abdomen.  The mesh provided at least 5-10 cm circumferential coverage around the entire region of hernia defects.   I tacked the edges & central part of the mesh to the peritoneum/posterior rectus fascia with SecureStrap absorbable tacks.   Hemostasis was excellent.  I primarily closed the umbilical ventral hernia using #1 PDS interrupted suturing in a transverse fashion to good result.  The diagnostic laparoscopy to prove no injury or other abnormality.  Hemostasis was good. Mesh laid well. Capnoperitoneum was evacuated. Ports were removed. The skin was closed with Monocryl at the port sites and Steri-Strips on the fascial stitch puncture sites.  I'm about to discuss operative findings with the patient's wife.  Adin Hector, M.D., F.A.C.S. Gastrointestinal and Minimally Invasive Surgery [facility] [address] [place],  [address] [phone] Main / Paging

## 2014-06-23 NOTE — Anesthesia Preprocedure Evaluation (Addendum)
Anesthesia Evaluation  Patient identified by MRN, date of birth, ID band Patient awake    Reviewed: Allergy & Precautions, H&P , NPO status , Patient's Chart, lab work & pertinent test results  Airway Mallampati: II  TM Distance: >3 FB Neck ROM: Full    Dental no notable dental hx.    Pulmonary neg pulmonary ROS, sleep apnea ,  breath sounds clear to auscultation  Pulmonary exam normal       Cardiovascular hypertension, Pt. on medications negative cardio ROS  Rhythm:Regular Rate:Normal     Neuro/Psych negative neurological ROS  negative psych ROS   GI/Hepatic negative GI ROS, Neg liver ROS,   Endo/Other  negative endocrine ROS  Renal/GU negative Renal ROS  negative genitourinary   Musculoskeletal negative musculoskeletal ROS (+)   Abdominal   Peds negative pediatric ROS (+)  Hematology negative hematology ROS (+)   Anesthesia Other Findings   Reproductive/Obstetrics negative OB ROS                            Anesthesia Physical Anesthesia Plan  ASA: II  Anesthesia Plan: General   Post-op Pain Management:    Induction: Intravenous  Airway Management Planned: Oral ETT  Additional Equipment:   Intra-op Plan:   Post-operative Plan: Extubation in OR  Informed Consent: I have reviewed the patients History and Physical, chart, labs and discussed the procedure including the risks, benefits and alternatives for the proposed anesthesia with the patient or authorized representative who has indicated his/her understanding and acceptance.   Dental advisory given  Plan Discussed with: CRNA  Anesthesia Plan Comments:         Anesthesia Quick Evaluation

## 2014-06-23 NOTE — Anesthesia Postprocedure Evaluation (Signed)
  Anesthesia Post-op Note  Patient: John Novak  Procedure(s) Performed: Procedure(s) (LRB): LAPAROSCOPIC VENTRAL WALL HERNIA REPAIR (N/A) INSERTION OF MESH (N/A)  Patient Location: PACU  Anesthesia Type: General  Level of Consciousness: awake and alert   Airway and Oxygen Therapy: Patient Spontanous Breathing  Post-op Pain: mild  Post-op Assessment: Post-op Vital signs reviewed, Patient's Cardiovascular Status Stable, Respiratory Function Stable, Patent Airway and No signs of Nausea or vomiting  Last Vitals:  Filed Vitals:   06/23/14 1800  BP: 124/60  Pulse: 65  Temp: 36.6 C  Resp:     Post-op Vital Signs: stable   Complications: No apparent anesthesia complications

## 2014-06-23 NOTE — Transfer of Care (Signed)
Immediate Anesthesia Transfer of Care Note  Patient: John Novak  Procedure(s) Performed: Procedure(s): LAPAROSCOPIC VENTRAL WALL HERNIA REPAIR (N/A) INSERTION OF MESH (N/A)  Patient Location: PACU  Anesthesia Type:General  Level of Consciousness: awake, alert  and oriented  Airway & Oxygen Therapy: Patient Spontanous Breathing and Patient connected to face mask oxygen  Post-op Assessment: Report given to PACU RN and Post -op Vital signs reviewed and stable  Post vital signs: Reviewed and stable  Complications: No apparent anesthesia complications

## 2014-06-23 NOTE — H&P (View-Only) (Signed)
[facility] 06/14/2014 9:30 AM Location: [facility] Patient #: [id number] DOB: August 20, 1945 Married / Language: Cleophus Molt / Race: White Male History of Present Illness Adin Hector MD; 06/14/2014 10:07 AM) Patient words: umb hernia.  The patient is a 68 year old male who presents with an umbilical hernia. Patient sent to me for concern of an umbilical hernia by Dr Rolland Porter, [facility] ED Pleasant active male. Comes today with his wife. 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 He has noticed a small lump at his belly button for the past year. It was not really bothering him until an episode where he was very swollen like a size of a plum. Very painful. Eventually reduced down. Discussed with his primary care physician. Told to go to emergency room. Was reducible at the time. CT scan confirmed a hernia and no other abnormalities. Surgical consultation requested. Patient has transitioned to much lighter activity level but wants to be more active at some point. Denies any episodes of pain. No history of abdominal surgery. No skin infection. He does take a aspirin every other day. Claims he can walk 30 minutes without difficulty. No history of heart attack or stroke. Study Result CLINICAL DATA: Focal swelling in the periumbilical region with pain and nausea yesterday which has resolved in the interval from the prior day. EXAM: CT ABDOMEN AND PELVIS WITH CONTRAST TECHNIQUE: Multidetector CT imaging of the abdomen and pelvis was performed using the standard protocol following bolus administration of intravenous contrast. CONTRAST: 198mL OMNIPAQUE IOHEXOL 300 MG/ML SOLN COMPARISON: None. FINDINGS: Lung bases are free of acute infiltrate or sizable effusion. The liver, gallbladder, spleen, adrenal glands and pancreas are all normal in their CT appearance. The kidneys are well visualized bilaterally and reveal multiple cystic changes slightly  worse on the left than the right. No calculi or obstructive changes are seen. Diffuse aortoiliac calcifications are noted. The appendix is well visualized and within normal limits. Mild diverticular change is noted without diverticulitis. The bladder is well distended. No pelvic mass lesion is seen. No lymphadenopathy is noted. In the anterior abdominal wall there is a small umbilical hernia containing omental fat. Very mild inflammatory changes are noted. This likely corresponds to the previously described swelling in the umbilical region. No acute bony abnormality is noted. IMPRESSION: Umbilical hernia containing omental fat. This is likely the etiology of the swelling noted on the previous day by the patient. No incarceration is noted. Renal cystic change without acute abnormality. Diverticulosis without diverticulitis. Electronically Signed By: Inez Catalina M.D. On: 05/30/2014 18:29 Other Problems ([name], CMA; 06/14/2014 9:30 AM) Anxiety Disorder Arthritis Bladder Problems Diverticulosis Enlarged Prostate Gastroesophageal Reflux Disease Hemorrhoids High blood pressure Hypercholesterolemia Sleep Apnea  Past Surgical History Marjean Donna, CMA; 06/14/2014 9:30 AM) Colon Polyp Removal - Colonoscopy Knee Surgery Bilateral. Oral Surgery Shoulder Surgery Left. Spinal Surgery - Lower Back  Diagnostic Studies History Marjean Donna, CMA; 06/14/2014 9:30 AM) Colonoscopy within last year  Allergies Davy Pique Bynum, CMA; 06/14/2014 9:31 AM) HYDROcodone Bitartrate *CHEMICALS* Latex Exam Gloves *MEDICAL DEVICES AND SUPPLIES*  Medication History Davy Pique Bynum, CMA; 06/14/2014 9:32 AM) ClonazePAM (0.5MG  Tablet, Oral) Active. LORazepam (0.5MG  Tablet, Oral) Active. Zolpidem Tartrate (10MG  Tablet, Oral) Active. Allopurinol (300MG  Tablet, Oral) Active. Dexilant (60MG  Capsule DR, Oral) Active. Lisinopril (40MG  Tablet, Oral) Active. Olopatadine HCl (0.6% Solution,  Nasal) Active. Simvastatin (80MG  Tablet, Oral) Active. Tamsulosin HCl (0.4MG  Capsule, Oral) Active.  Social History ([place]; 06/14/2014 9:30 AM) Caffeine use  Coffee, Tea. Tobacco use Never smoker.  Family History Marjean Donna, CMA; 06/14/2014 9:30 AM) Alcohol Abuse Father. Breast Cancer Sister. Diabetes Mellitus Mother, Son. Heart disease in male family member before age 18     Review of Systems ([place]thel; 06/14/2014 9:30 AM) General Not Present- Appetite Loss, Chills, Fatigue, Fever, Night Sweats, Weight Gain and Weight Loss. Skin Present- Dryness. Not Present- Change in Wart/Mole, Hives, Jaundice, New Lesions, Non-Healing Wounds, Rash and Ulcer. HEENT Present- Seasonal Allergies, Visual Disturbances and Wears glasses/contact lenses. Not Present- Earache, Hearing Loss, Hoarseness, Nose Bleed, Oral Ulcers, Ringing in the Ears, Sinus Pain, Sore Throat and Yellow Eyes. Gastrointestinal Present- Bloating and Hemorrhoids. Not Present- Abdominal Pain, Bloody Stool, Change in Bowel Habits, Chronic diarrhea, Constipation, Difficulty Swallowing, Excessive gas, Gets full quickly at meals, Indigestion, Nausea, Rectal Pain and Vomiting. Musculoskeletal Present- Back Pain. Not Present- Joint Pain, Joint Stiffness, Muscle Pain, Muscle Weakness and Swelling of Extremities. Neurological Present- Decreased Memory. Not Present- Fainting, Headaches, Numbness, Seizures, Tingling, Tremor, Trouble walking and Weakness.  Vitals ([name] CMA; 06/14/2014 9:31 AM) 06/14/2014 9:30 AM Weight: 230 lb Height: 71in Body Surface Area: 2.29 m Body Mass Index: 32.08 kg/m Temp.: 52F(Temporal)  Pulse: 76 (Regular)  BP: 124/74 (Sitting, Left Arm, Standard)     Physical Exam Adin Hector MD; 06/14/2014 10:03 AM)  General Mental Status-Alert. General Appearance-Not in acute distress, Not Sickly. Orientation-Oriented X3. Hydration-Well  hydrated. Voice-Normal.  Integumentary Global Assessment Upon inspection and palpation of skin surfaces of the - Axillae: non-tender, no inflammation or ulceration, no drainage. and Distribution of scalp and body hair is normal. General Characteristics Temperature - normal warmth is noted.  Head and Neck Head-normocephalic, atraumatic with no lesions or palpable masses. Face Global Assessment - atraumatic, no absence of expression. Neck Global Assessment - no abnormal movements, no bruit auscultated on the right, no bruit auscultated on the left, no decreased range of motion, non-tender. Trachea-midline. Thyroid Gland Characteristics - non-tender.  Eye Eyeball - Left-Extraocular movements intact, No Nystagmus. Eyeball - Right-Extraocular movements intact, No Nystagmus. Cornea - Left-No Hazy. Cornea - Right-No Hazy. Sclera/Conjunctiva - Left-No scleral icterus, No Discharge. Sclera/Conjunctiva - Right-No scleral icterus, No Discharge. Pupil - Left-Direct reaction to light normal. Pupil - Right-Direct reaction to light normal.  ENMT Ears Pinna - Left - no drainage observed, no generalized tenderness observed. Right - no drainage observed, no generalized tenderness observed. Nose and Sinuses External Inspection of the Nose - no destructive lesion observed. Inspection of the nares - Left - quiet respiration. Right - quiet respiration. Mouth and Throat Lips - Upper Lip - no fissures observed, no pallor noted. Lower Lip - no fissures observed, no pallor noted. Nasopharynx - no discharge present. Oral Cavity/Oropharynx - Tongue - no dryness observed. Oral Mucosa - no cyanosis observed. Hypopharynx - no evidence of airway distress observed.  Chest and Lung Exam Inspection Movements - Normal and Symmetrical. Accessory muscles - No use of accessory muscles in breathing. Palpation Palpation of the chest reveals - Non-tender. Auscultation Breath sounds - Normal  and Clear.  Cardiovascular Auscultation Rhythm - Regular. Murmurs & Other Heart Sounds - Auscultation of the heart reveals - No Murmurs and No Systolic Clicks.  Abdomen Inspection Inspection of the abdomen reveals - No Visible peristalsis and No Abnormal pulsations. Umbilicus - No Bleeding, No Urine drainage. Palpation/Percussion Palpation and Percussion of the abdomen reveal - Soft, Non Tender, No Rebound tenderness, No Rigidity (guarding) and No Cutaneous hyperesthesia. Note: Obese but soft. Mild wire  diastases recti.  Obvious umbilical hernia. 2 cm. Soft and reducible.   Male Genitourinary Sexual Maturity Tanner 5 - Adult hair pattern and Adult penile size and shape. Note: No obvious inguinal hernias. Testes and spermatic cord normal.   Rectal - Did not examine.  Peripheral Vascular Upper Extremity Inspection - Left - No Cyanotic nailbeds, Not Ischemic. Right - No Cyanotic nailbeds, Not Ischemic.  Neurologic Neurologic evaluation reveals -normal attention span and ability to concentrate, able to name objects and repeat phrases. Appropriate fund of knowledge , normal sensation and normal coordination. Mental Status Affect - not angry, not paranoid. Cranial Nerves-Normal Bilaterally. Gait-Normal.  Neuropsychiatric Mental status exam performed with findings of-able to articulate well with normal speech/language, rate, volume and coherence, thought content normal with ability to perform basic computations and apply abstract reasoning and no evidence of hallucinations, delusions, obsessions or homicidal/suicidal ideation.  Musculoskeletal Global Assessment Spine, Ribs and Pelvis - no instability, subluxation or laxity. Right Upper Extremity - no instability, subluxation or laxity.  Lymphatic Head & Neck  General Head & Neck Lymphatics: Bilateral - Description - No Localized lymphadenopathy. Axillary  General Axillary Region: Bilateral - Description - No  Localized lymphadenopathy. Femoral & Inguinal  Generalized Femoral & Inguinal Lymphatics: Left - Description - No Localized lymphadenopathy. Right - Description - No Localized lymphadenopathy.    Assessment & Plan Adin Hector MD; 17/10/812 48:18 AM)  UMBILICAL HERNIA WITHOUT OBSTRUCTION AND WITHOUT GANGRENE (553.1  K42.9) Impression: Small umbilical hernia by with 1 significant episode of incarceration. I think he would benefit from surgical repair. He and especially is wife are interested as well. He is hoping to wait until next month. She is wondering it done as soon as possible. Because he has had no further episodes and has transitioned to lower intensity of his work, I think it is okay to wait a month as long as he has no more episodes of incarceration. Discussed with the patient has wife. Given his activity level and obesity, would do underlay repair with mesh. He is interested in having a more durable repair as well:  Current Plans Schedule for Surgery Pt Education - [facility] Hernia Post-Op HCI (Hind Chesler): discussed with patient and provided information. Pt Education - [facility] Pain Control (Matilynn Dacey) Pt Education - [facility] Good Bowel Health (Anikin Prosser) The anatomy & physiology of the abdominal wall was discussed. The pathophysiology of hernias was discussed. Natural history risks without surgery including progeressive enlargement, pain, incarceration, & strangulation was discussed. Contributors to complications such as smoking, obesity, diabetes, prior surgery, etc were discussed.  I feel the risks of no intervention will lead to serious problems that outweigh the operative risks; therefore, I recommended surgery to reduce and repair the hernia. I explained laparoscopic techniques with possible need for an open approach. I noted the probable use of mesh to patch and/or buttress the hernia repair  Risks such as bleeding, infection, abscess, need for further treatment, heart attack, death, and other risks  were discussed. I noted a good likelihood this will help address the problem. Goals of post-operative recovery were discussed as well. Possibility that this will not correct all symptoms was explained. I stressed the importance of low-impact activity, aggressive pain control, avoiding constipation, & not pushing through pain to minimize risk of post-operative chronic pain or injury. Possibility of reherniation especially with smoking, obesity, diabetes, immunosuppression, and other health conditions was discussed. We will work to minimize complications.  An educational handout further explaining the pathology & treatment options was given  as well. Questions were answered. The patient expresses understanding & wishes to proceed with surgery.  Adin Hector, M.D., F.A.C.S. Gastrointestinal and Minimally Invasive Surgery [facility] [address], [place], [place] [address] [phone] Main / Paging

## 2014-06-26 ENCOUNTER — Encounter (HOSPITAL_COMMUNITY): Payer: Self-pay | Admitting: Surgery

## 2014-08-01 DIAGNOSIS — K219 Gastro-esophageal reflux disease without esophagitis: Secondary | ICD-10-CM | POA: Diagnosis not present

## 2014-08-01 DIAGNOSIS — R140 Abdominal distension (gaseous): Secondary | ICD-10-CM | POA: Diagnosis not present

## 2014-08-24 DIAGNOSIS — G245 Blepharospasm: Secondary | ICD-10-CM | POA: Diagnosis not present

## 2014-09-01 DIAGNOSIS — J301 Allergic rhinitis due to pollen: Secondary | ICD-10-CM | POA: Diagnosis not present

## 2014-09-01 DIAGNOSIS — J3089 Other allergic rhinitis: Secondary | ICD-10-CM | POA: Diagnosis not present

## 2014-09-01 DIAGNOSIS — H1045 Other chronic allergic conjunctivitis: Secondary | ICD-10-CM | POA: Diagnosis not present

## 2014-09-01 DIAGNOSIS — R05 Cough: Secondary | ICD-10-CM | POA: Diagnosis not present

## 2014-10-19 DIAGNOSIS — G4700 Insomnia, unspecified: Secondary | ICD-10-CM | POA: Diagnosis not present

## 2014-10-19 DIAGNOSIS — G4733 Obstructive sleep apnea (adult) (pediatric): Secondary | ICD-10-CM | POA: Diagnosis not present

## 2014-11-06 DIAGNOSIS — I10 Essential (primary) hypertension: Secondary | ICD-10-CM | POA: Diagnosis not present

## 2014-11-06 DIAGNOSIS — Z125 Encounter for screening for malignant neoplasm of prostate: Secondary | ICD-10-CM | POA: Diagnosis not present

## 2014-11-06 DIAGNOSIS — E782 Mixed hyperlipidemia: Secondary | ICD-10-CM | POA: Diagnosis not present

## 2014-11-09 DIAGNOSIS — G245 Blepharospasm: Secondary | ICD-10-CM | POA: Diagnosis not present

## 2014-11-10 DIAGNOSIS — Z791 Long term (current) use of non-steroidal anti-inflammatories (NSAID): Secondary | ICD-10-CM | POA: Diagnosis not present

## 2014-11-10 DIAGNOSIS — I10 Essential (primary) hypertension: Secondary | ICD-10-CM | POA: Diagnosis not present

## 2014-11-10 DIAGNOSIS — K589 Irritable bowel syndrome without diarrhea: Secondary | ICD-10-CM | POA: Diagnosis not present

## 2014-11-10 DIAGNOSIS — R972 Elevated prostate specific antigen [PSA]: Secondary | ICD-10-CM | POA: Diagnosis not present

## 2014-11-10 DIAGNOSIS — M109 Gout, unspecified: Secondary | ICD-10-CM | POA: Diagnosis not present

## 2014-11-10 DIAGNOSIS — M1990 Unspecified osteoarthritis, unspecified site: Secondary | ICD-10-CM | POA: Diagnosis not present

## 2014-11-10 DIAGNOSIS — G244 Idiopathic orofacial dystonia: Secondary | ICD-10-CM | POA: Diagnosis not present

## 2014-11-10 DIAGNOSIS — Z1389 Encounter for screening for other disorder: Secondary | ICD-10-CM | POA: Diagnosis not present

## 2014-11-10 DIAGNOSIS — E782 Mixed hyperlipidemia: Secondary | ICD-10-CM | POA: Diagnosis not present

## 2014-11-10 DIAGNOSIS — G4733 Obstructive sleep apnea (adult) (pediatric): Secondary | ICD-10-CM | POA: Diagnosis not present

## 2014-11-10 DIAGNOSIS — R350 Frequency of micturition: Secondary | ICD-10-CM | POA: Diagnosis not present

## 2014-11-21 DIAGNOSIS — M9903 Segmental and somatic dysfunction of lumbar region: Secondary | ICD-10-CM | POA: Diagnosis not present

## 2014-11-21 DIAGNOSIS — M546 Pain in thoracic spine: Secondary | ICD-10-CM | POA: Diagnosis not present

## 2014-11-21 DIAGNOSIS — M9901 Segmental and somatic dysfunction of cervical region: Secondary | ICD-10-CM | POA: Diagnosis not present

## 2014-11-21 DIAGNOSIS — M545 Low back pain: Secondary | ICD-10-CM | POA: Diagnosis not present

## 2014-11-21 DIAGNOSIS — M542 Cervicalgia: Secondary | ICD-10-CM | POA: Diagnosis not present

## 2014-11-21 DIAGNOSIS — M9902 Segmental and somatic dysfunction of thoracic region: Secondary | ICD-10-CM | POA: Diagnosis not present

## 2014-11-24 DIAGNOSIS — M9901 Segmental and somatic dysfunction of cervical region: Secondary | ICD-10-CM | POA: Diagnosis not present

## 2014-11-24 DIAGNOSIS — M545 Low back pain: Secondary | ICD-10-CM | POA: Diagnosis not present

## 2014-11-24 DIAGNOSIS — M542 Cervicalgia: Secondary | ICD-10-CM | POA: Diagnosis not present

## 2014-11-24 DIAGNOSIS — M9902 Segmental and somatic dysfunction of thoracic region: Secondary | ICD-10-CM | POA: Diagnosis not present

## 2014-11-24 DIAGNOSIS — M546 Pain in thoracic spine: Secondary | ICD-10-CM | POA: Diagnosis not present

## 2014-11-24 DIAGNOSIS — M9903 Segmental and somatic dysfunction of lumbar region: Secondary | ICD-10-CM | POA: Diagnosis not present

## 2014-11-28 DIAGNOSIS — M9903 Segmental and somatic dysfunction of lumbar region: Secondary | ICD-10-CM | POA: Diagnosis not present

## 2014-11-28 DIAGNOSIS — M546 Pain in thoracic spine: Secondary | ICD-10-CM | POA: Diagnosis not present

## 2014-11-28 DIAGNOSIS — M9901 Segmental and somatic dysfunction of cervical region: Secondary | ICD-10-CM | POA: Diagnosis not present

## 2014-11-28 DIAGNOSIS — M542 Cervicalgia: Secondary | ICD-10-CM | POA: Diagnosis not present

## 2014-11-28 DIAGNOSIS — M9902 Segmental and somatic dysfunction of thoracic region: Secondary | ICD-10-CM | POA: Diagnosis not present

## 2014-11-28 DIAGNOSIS — M545 Low back pain: Secondary | ICD-10-CM | POA: Diagnosis not present

## 2014-11-30 DIAGNOSIS — Z09 Encounter for follow-up examination after completed treatment for conditions other than malignant neoplasm: Secondary | ICD-10-CM | POA: Diagnosis not present

## 2014-11-30 DIAGNOSIS — G243 Spasmodic torticollis: Secondary | ICD-10-CM | POA: Diagnosis not present

## 2014-12-01 DIAGNOSIS — K649 Unspecified hemorrhoids: Secondary | ICD-10-CM | POA: Diagnosis not present

## 2014-12-01 DIAGNOSIS — R972 Elevated prostate specific antigen [PSA]: Secondary | ICD-10-CM | POA: Diagnosis not present

## 2014-12-12 DIAGNOSIS — M9903 Segmental and somatic dysfunction of lumbar region: Secondary | ICD-10-CM | POA: Diagnosis not present

## 2014-12-12 DIAGNOSIS — M9901 Segmental and somatic dysfunction of cervical region: Secondary | ICD-10-CM | POA: Diagnosis not present

## 2014-12-12 DIAGNOSIS — M542 Cervicalgia: Secondary | ICD-10-CM | POA: Diagnosis not present

## 2014-12-12 DIAGNOSIS — M546 Pain in thoracic spine: Secondary | ICD-10-CM | POA: Diagnosis not present

## 2014-12-12 DIAGNOSIS — M9902 Segmental and somatic dysfunction of thoracic region: Secondary | ICD-10-CM | POA: Diagnosis not present

## 2014-12-12 DIAGNOSIS — M545 Low back pain: Secondary | ICD-10-CM | POA: Diagnosis not present

## 2015-01-05 DIAGNOSIS — M9901 Segmental and somatic dysfunction of cervical region: Secondary | ICD-10-CM | POA: Diagnosis not present

## 2015-01-05 DIAGNOSIS — M542 Cervicalgia: Secondary | ICD-10-CM | POA: Diagnosis not present

## 2015-01-05 DIAGNOSIS — M9902 Segmental and somatic dysfunction of thoracic region: Secondary | ICD-10-CM | POA: Diagnosis not present

## 2015-01-05 DIAGNOSIS — M545 Low back pain: Secondary | ICD-10-CM | POA: Diagnosis not present

## 2015-01-05 DIAGNOSIS — M546 Pain in thoracic spine: Secondary | ICD-10-CM | POA: Diagnosis not present

## 2015-01-05 DIAGNOSIS — M9903 Segmental and somatic dysfunction of lumbar region: Secondary | ICD-10-CM | POA: Diagnosis not present

## 2015-01-16 DIAGNOSIS — M9901 Segmental and somatic dysfunction of cervical region: Secondary | ICD-10-CM | POA: Diagnosis not present

## 2015-01-16 DIAGNOSIS — M9902 Segmental and somatic dysfunction of thoracic region: Secondary | ICD-10-CM | POA: Diagnosis not present

## 2015-01-16 DIAGNOSIS — M9903 Segmental and somatic dysfunction of lumbar region: Secondary | ICD-10-CM | POA: Diagnosis not present

## 2015-01-16 DIAGNOSIS — M546 Pain in thoracic spine: Secondary | ICD-10-CM | POA: Diagnosis not present

## 2015-01-16 DIAGNOSIS — M542 Cervicalgia: Secondary | ICD-10-CM | POA: Diagnosis not present

## 2015-01-16 DIAGNOSIS — M545 Low back pain: Secondary | ICD-10-CM | POA: Diagnosis not present

## 2015-01-25 DIAGNOSIS — G245 Blepharospasm: Secondary | ICD-10-CM | POA: Diagnosis not present

## 2015-02-05 ENCOUNTER — Ambulatory Visit (INDEPENDENT_AMBULATORY_CARE_PROVIDER_SITE_OTHER): Payer: Medicare Other | Admitting: Podiatry

## 2015-02-05 ENCOUNTER — Encounter: Payer: Self-pay | Admitting: Podiatry

## 2015-02-05 VITALS — BP 126/79 | HR 64 | Resp 15

## 2015-02-05 DIAGNOSIS — M779 Enthesopathy, unspecified: Secondary | ICD-10-CM | POA: Diagnosis not present

## 2015-02-05 NOTE — Progress Notes (Signed)
   Subjective:    Patient ID: John Novak, male    DOB: 01/31/1946, 69 y.o.   MRN: [id number]  HPI Pt presents requesting orthotics, also c/o left 4th nail discoloration and thickness   Review of Systems  All other systems reviewed and are negative.      Objective:   Physical Exam        Assessment & Plan:

## 2015-02-06 NOTE — Progress Notes (Signed)
Subjective:     Patient ID: John Novak, male   DOB: 06-13-1946, 69 y.o.   MRN: [id number]  HPI patient presents stating that he needs new orthotics and that his feet do get sore he is on them too much along with discoloration fourth toe left   Review of Systems  All other systems reviewed and are negative.      Objective:   Physical Exam  Constitutional: He is oriented to person, place, and time.  Cardiovascular: Intact distal pulses.   Musculoskeletal: Normal range of motion.  Neurological: He is oriented to person, place, and time.  Skin: Skin is warm and dry.  Nursing note and vitals reviewed.  neurovascular status found to be intact muscle strength was found to be adequate with range of motion diminished subtalar midtarsal joint and moderate depression of the arch bilateral. Has over sulcus length orthotics that are not providing significant support and do have reduction of the cushion     Assessment:     Chronic tendinitis with orthotics which are no longer providing for proper support    Plan:     Patient at this time is scanned for new orthotics and he would like a second pair but first I want to see how he does with first pair before making that decision

## 2015-02-14 DIAGNOSIS — M545 Low back pain: Secondary | ICD-10-CM | POA: Diagnosis not present

## 2015-02-14 DIAGNOSIS — M5030 Other cervical disc degeneration, unspecified cervical region: Secondary | ICD-10-CM | POA: Diagnosis not present

## 2015-02-14 DIAGNOSIS — M9901 Segmental and somatic dysfunction of cervical region: Secondary | ICD-10-CM | POA: Diagnosis not present

## 2015-02-14 DIAGNOSIS — M5134 Other intervertebral disc degeneration, thoracic region: Secondary | ICD-10-CM | POA: Diagnosis not present

## 2015-02-14 DIAGNOSIS — M5136 Other intervertebral disc degeneration, lumbar region: Secondary | ICD-10-CM | POA: Diagnosis not present

## 2015-02-14 DIAGNOSIS — M9903 Segmental and somatic dysfunction of lumbar region: Secondary | ICD-10-CM | POA: Diagnosis not present

## 2015-02-14 DIAGNOSIS — M9902 Segmental and somatic dysfunction of thoracic region: Secondary | ICD-10-CM | POA: Diagnosis not present

## 2015-02-14 DIAGNOSIS — M542 Cervicalgia: Secondary | ICD-10-CM | POA: Diagnosis not present

## 2015-02-14 DIAGNOSIS — M546 Pain in thoracic spine: Secondary | ICD-10-CM | POA: Diagnosis not present

## 2015-02-20 DIAGNOSIS — M9903 Segmental and somatic dysfunction of lumbar region: Secondary | ICD-10-CM | POA: Diagnosis not present

## 2015-02-20 DIAGNOSIS — M5030 Other cervical disc degeneration, unspecified cervical region: Secondary | ICD-10-CM | POA: Diagnosis not present

## 2015-02-20 DIAGNOSIS — M546 Pain in thoracic spine: Secondary | ICD-10-CM | POA: Diagnosis not present

## 2015-02-20 DIAGNOSIS — M9901 Segmental and somatic dysfunction of cervical region: Secondary | ICD-10-CM | POA: Diagnosis not present

## 2015-02-20 DIAGNOSIS — M5134 Other intervertebral disc degeneration, thoracic region: Secondary | ICD-10-CM | POA: Diagnosis not present

## 2015-02-20 DIAGNOSIS — M542 Cervicalgia: Secondary | ICD-10-CM | POA: Diagnosis not present

## 2015-02-20 DIAGNOSIS — M9902 Segmental and somatic dysfunction of thoracic region: Secondary | ICD-10-CM | POA: Diagnosis not present

## 2015-02-20 DIAGNOSIS — M545 Low back pain: Secondary | ICD-10-CM | POA: Diagnosis not present

## 2015-02-20 DIAGNOSIS — M5136 Other intervertebral disc degeneration, lumbar region: Secondary | ICD-10-CM | POA: Diagnosis not present

## 2015-02-22 DIAGNOSIS — M1811 Unilateral primary osteoarthritis of first carpometacarpal joint, right hand: Secondary | ICD-10-CM | POA: Diagnosis not present

## 2015-02-23 DIAGNOSIS — M9903 Segmental and somatic dysfunction of lumbar region: Secondary | ICD-10-CM | POA: Diagnosis not present

## 2015-02-23 DIAGNOSIS — M5134 Other intervertebral disc degeneration, thoracic region: Secondary | ICD-10-CM | POA: Diagnosis not present

## 2015-02-23 DIAGNOSIS — M546 Pain in thoracic spine: Secondary | ICD-10-CM | POA: Diagnosis not present

## 2015-02-23 DIAGNOSIS — M5136 Other intervertebral disc degeneration, lumbar region: Secondary | ICD-10-CM | POA: Diagnosis not present

## 2015-02-23 DIAGNOSIS — M9901 Segmental and somatic dysfunction of cervical region: Secondary | ICD-10-CM | POA: Diagnosis not present

## 2015-02-23 DIAGNOSIS — M9902 Segmental and somatic dysfunction of thoracic region: Secondary | ICD-10-CM | POA: Diagnosis not present

## 2015-02-23 DIAGNOSIS — M545 Low back pain: Secondary | ICD-10-CM | POA: Diagnosis not present

## 2015-02-23 DIAGNOSIS — M542 Cervicalgia: Secondary | ICD-10-CM | POA: Diagnosis not present

## 2015-02-23 DIAGNOSIS — M5030 Other cervical disc degeneration, unspecified cervical region: Secondary | ICD-10-CM | POA: Diagnosis not present

## 2015-03-02 ENCOUNTER — Ambulatory Visit: Payer: Medicare Other | Admitting: *Deleted

## 2015-03-02 DIAGNOSIS — M779 Enthesopathy, unspecified: Secondary | ICD-10-CM

## 2015-03-02 NOTE — Patient Instructions (Signed)

## 2015-03-02 NOTE — Progress Notes (Signed)
Patient ID: John Novak, male   DOB: 08-24-1945, 69 y.o.   MRN: [id number] Patient presents for orthotic pick up.  Verbal and written break in and wear instructions given.  Patient will follow up in 4 weeks if symptoms worsen or fail to improve.

## 2015-03-16 DIAGNOSIS — M5030 Other cervical disc degeneration, unspecified cervical region: Secondary | ICD-10-CM | POA: Diagnosis not present

## 2015-03-16 DIAGNOSIS — M5136 Other intervertebral disc degeneration, lumbar region: Secondary | ICD-10-CM | POA: Diagnosis not present

## 2015-03-16 DIAGNOSIS — M9903 Segmental and somatic dysfunction of lumbar region: Secondary | ICD-10-CM | POA: Diagnosis not present

## 2015-03-16 DIAGNOSIS — M5134 Other intervertebral disc degeneration, thoracic region: Secondary | ICD-10-CM | POA: Diagnosis not present

## 2015-03-16 DIAGNOSIS — M9902 Segmental and somatic dysfunction of thoracic region: Secondary | ICD-10-CM | POA: Diagnosis not present

## 2015-03-16 DIAGNOSIS — M542 Cervicalgia: Secondary | ICD-10-CM | POA: Diagnosis not present

## 2015-03-16 DIAGNOSIS — M546 Pain in thoracic spine: Secondary | ICD-10-CM | POA: Diagnosis not present

## 2015-03-16 DIAGNOSIS — M9901 Segmental and somatic dysfunction of cervical region: Secondary | ICD-10-CM | POA: Diagnosis not present

## 2015-03-16 DIAGNOSIS — M545 Low back pain: Secondary | ICD-10-CM | POA: Diagnosis not present

## 2015-03-21 DIAGNOSIS — H269 Unspecified cataract: Secondary | ICD-10-CM | POA: Diagnosis not present

## 2015-03-22 DIAGNOSIS — M1811 Unilateral primary osteoarthritis of first carpometacarpal joint, right hand: Secondary | ICD-10-CM | POA: Diagnosis not present

## 2015-03-23 DIAGNOSIS — M5136 Other intervertebral disc degeneration, lumbar region: Secondary | ICD-10-CM | POA: Diagnosis not present

## 2015-03-23 DIAGNOSIS — M542 Cervicalgia: Secondary | ICD-10-CM | POA: Diagnosis not present

## 2015-03-23 DIAGNOSIS — M5030 Other cervical disc degeneration, unspecified cervical region: Secondary | ICD-10-CM | POA: Diagnosis not present

## 2015-03-23 DIAGNOSIS — M5134 Other intervertebral disc degeneration, thoracic region: Secondary | ICD-10-CM | POA: Diagnosis not present

## 2015-03-23 DIAGNOSIS — M9902 Segmental and somatic dysfunction of thoracic region: Secondary | ICD-10-CM | POA: Diagnosis not present

## 2015-03-23 DIAGNOSIS — M545 Low back pain: Secondary | ICD-10-CM | POA: Diagnosis not present

## 2015-03-23 DIAGNOSIS — M546 Pain in thoracic spine: Secondary | ICD-10-CM | POA: Diagnosis not present

## 2015-03-23 DIAGNOSIS — M9901 Segmental and somatic dysfunction of cervical region: Secondary | ICD-10-CM | POA: Diagnosis not present

## 2015-03-23 DIAGNOSIS — M9903 Segmental and somatic dysfunction of lumbar region: Secondary | ICD-10-CM | POA: Diagnosis not present

## 2015-03-29 DIAGNOSIS — D1801 Hemangioma of skin and subcutaneous tissue: Secondary | ICD-10-CM | POA: Diagnosis not present

## 2015-03-29 DIAGNOSIS — L821 Other seborrheic keratosis: Secondary | ICD-10-CM | POA: Diagnosis not present

## 2015-03-29 DIAGNOSIS — L820 Inflamed seborrheic keratosis: Secondary | ICD-10-CM | POA: Diagnosis not present

## 2015-04-04 DIAGNOSIS — G245 Blepharospasm: Secondary | ICD-10-CM | POA: Diagnosis not present

## 2015-04-05 DIAGNOSIS — G243 Spasmodic torticollis: Secondary | ICD-10-CM | POA: Diagnosis not present

## 2015-04-05 DIAGNOSIS — G244 Idiopathic orofacial dystonia: Secondary | ICD-10-CM | POA: Diagnosis not present

## 2015-04-19 DIAGNOSIS — M1811 Unilateral primary osteoarthritis of first carpometacarpal joint, right hand: Secondary | ICD-10-CM | POA: Diagnosis not present

## 2015-04-28 DIAGNOSIS — Z23 Encounter for immunization: Secondary | ICD-10-CM | POA: Diagnosis not present

## 2015-05-08 DIAGNOSIS — M9902 Segmental and somatic dysfunction of thoracic region: Secondary | ICD-10-CM | POA: Diagnosis not present

## 2015-05-08 DIAGNOSIS — M5030 Other cervical disc degeneration, unspecified cervical region: Secondary | ICD-10-CM | POA: Diagnosis not present

## 2015-05-08 DIAGNOSIS — M5134 Other intervertebral disc degeneration, thoracic region: Secondary | ICD-10-CM | POA: Diagnosis not present

## 2015-05-08 DIAGNOSIS — M545 Low back pain: Secondary | ICD-10-CM | POA: Diagnosis not present

## 2015-05-08 DIAGNOSIS — M5136 Other intervertebral disc degeneration, lumbar region: Secondary | ICD-10-CM | POA: Diagnosis not present

## 2015-05-08 DIAGNOSIS — M9901 Segmental and somatic dysfunction of cervical region: Secondary | ICD-10-CM | POA: Diagnosis not present

## 2015-05-08 DIAGNOSIS — M542 Cervicalgia: Secondary | ICD-10-CM | POA: Diagnosis not present

## 2015-05-08 DIAGNOSIS — M9903 Segmental and somatic dysfunction of lumbar region: Secondary | ICD-10-CM | POA: Diagnosis not present

## 2015-05-08 DIAGNOSIS — M546 Pain in thoracic spine: Secondary | ICD-10-CM | POA: Diagnosis not present

## 2015-05-11 DIAGNOSIS — M545 Low back pain: Secondary | ICD-10-CM | POA: Diagnosis not present

## 2015-05-11 DIAGNOSIS — M9903 Segmental and somatic dysfunction of lumbar region: Secondary | ICD-10-CM | POA: Diagnosis not present

## 2015-05-11 DIAGNOSIS — M546 Pain in thoracic spine: Secondary | ICD-10-CM | POA: Diagnosis not present

## 2015-05-11 DIAGNOSIS — M9902 Segmental and somatic dysfunction of thoracic region: Secondary | ICD-10-CM | POA: Diagnosis not present

## 2015-05-11 DIAGNOSIS — M5134 Other intervertebral disc degeneration, thoracic region: Secondary | ICD-10-CM | POA: Diagnosis not present

## 2015-05-11 DIAGNOSIS — M5030 Other cervical disc degeneration, unspecified cervical region: Secondary | ICD-10-CM | POA: Diagnosis not present

## 2015-05-11 DIAGNOSIS — M5136 Other intervertebral disc degeneration, lumbar region: Secondary | ICD-10-CM | POA: Diagnosis not present

## 2015-05-11 DIAGNOSIS — M542 Cervicalgia: Secondary | ICD-10-CM | POA: Diagnosis not present

## 2015-05-11 DIAGNOSIS — M9901 Segmental and somatic dysfunction of cervical region: Secondary | ICD-10-CM | POA: Diagnosis not present

## 2015-05-15 DIAGNOSIS — N400 Enlarged prostate without lower urinary tract symptoms: Secondary | ICD-10-CM | POA: Diagnosis not present

## 2015-05-15 DIAGNOSIS — E782 Mixed hyperlipidemia: Secondary | ICD-10-CM | POA: Diagnosis not present

## 2015-05-15 DIAGNOSIS — G244 Idiopathic orofacial dystonia: Secondary | ICD-10-CM | POA: Diagnosis not present

## 2015-05-15 DIAGNOSIS — K219 Gastro-esophageal reflux disease without esophagitis: Secondary | ICD-10-CM | POA: Diagnosis not present

## 2015-05-15 DIAGNOSIS — Z6835 Body mass index (BMI) 35.0-35.9, adult: Secondary | ICD-10-CM | POA: Diagnosis not present

## 2015-05-15 DIAGNOSIS — M109 Gout, unspecified: Secondary | ICD-10-CM | POA: Diagnosis not present

## 2015-05-15 DIAGNOSIS — G4733 Obstructive sleep apnea (adult) (pediatric): Secondary | ICD-10-CM | POA: Diagnosis not present

## 2015-05-15 DIAGNOSIS — I10 Essential (primary) hypertension: Secondary | ICD-10-CM | POA: Diagnosis not present

## 2015-05-15 DIAGNOSIS — M1990 Unspecified osteoarthritis, unspecified site: Secondary | ICD-10-CM | POA: Diagnosis not present

## 2015-05-15 DIAGNOSIS — Z791 Long term (current) use of non-steroidal anti-inflammatories (NSAID): Secondary | ICD-10-CM | POA: Diagnosis not present

## 2015-05-15 DIAGNOSIS — K589 Irritable bowel syndrome without diarrhea: Secondary | ICD-10-CM | POA: Diagnosis not present

## 2015-05-22 DIAGNOSIS — C44311 Basal cell carcinoma of skin of nose: Secondary | ICD-10-CM | POA: Diagnosis not present

## 2015-05-22 DIAGNOSIS — D485 Neoplasm of uncertain behavior of skin: Secondary | ICD-10-CM | POA: Diagnosis not present

## 2015-05-22 DIAGNOSIS — L821 Other seborrheic keratosis: Secondary | ICD-10-CM | POA: Diagnosis not present

## 2015-06-20 DIAGNOSIS — G245 Blepharospasm: Secondary | ICD-10-CM | POA: Diagnosis not present

## 2015-06-25 DIAGNOSIS — Z85828 Personal history of other malignant neoplasm of skin: Secondary | ICD-10-CM | POA: Diagnosis not present

## 2015-06-25 DIAGNOSIS — C44311 Basal cell carcinoma of skin of nose: Secondary | ICD-10-CM | POA: Diagnosis not present

## 2015-07-27 DIAGNOSIS — K219 Gastro-esophageal reflux disease without esophagitis: Secondary | ICD-10-CM | POA: Diagnosis not present

## 2015-07-27 DIAGNOSIS — Z86010 Personal history of colonic polyps: Secondary | ICD-10-CM | POA: Diagnosis not present

## 2015-08-28 DIAGNOSIS — H1045 Other chronic allergic conjunctivitis: Secondary | ICD-10-CM | POA: Diagnosis not present

## 2015-08-28 DIAGNOSIS — L821 Other seborrheic keratosis: Secondary | ICD-10-CM | POA: Diagnosis not present

## 2015-08-28 DIAGNOSIS — J3089 Other allergic rhinitis: Secondary | ICD-10-CM | POA: Diagnosis not present

## 2015-08-28 DIAGNOSIS — J301 Allergic rhinitis due to pollen: Secondary | ICD-10-CM | POA: Diagnosis not present

## 2015-08-28 DIAGNOSIS — R05 Cough: Secondary | ICD-10-CM | POA: Diagnosis not present

## 2015-08-28 DIAGNOSIS — Z85828 Personal history of other malignant neoplasm of skin: Secondary | ICD-10-CM | POA: Diagnosis not present

## 2015-08-28 DIAGNOSIS — I788 Other diseases of capillaries: Secondary | ICD-10-CM | POA: Diagnosis not present

## 2015-08-30 DIAGNOSIS — G244 Idiopathic orofacial dystonia: Secondary | ICD-10-CM | POA: Diagnosis not present

## 2015-08-30 DIAGNOSIS — G243 Spasmodic torticollis: Secondary | ICD-10-CM | POA: Diagnosis not present

## 2015-09-05 DIAGNOSIS — G245 Blepharospasm: Secondary | ICD-10-CM | POA: Diagnosis not present

## 2015-10-22 DIAGNOSIS — M542 Cervicalgia: Secondary | ICD-10-CM | POA: Diagnosis not present

## 2015-10-22 DIAGNOSIS — M545 Low back pain: Secondary | ICD-10-CM | POA: Diagnosis not present

## 2015-10-22 DIAGNOSIS — M5134 Other intervertebral disc degeneration, thoracic region: Secondary | ICD-10-CM | POA: Diagnosis not present

## 2015-10-22 DIAGNOSIS — M9901 Segmental and somatic dysfunction of cervical region: Secondary | ICD-10-CM | POA: Diagnosis not present

## 2015-10-22 DIAGNOSIS — M5030 Other cervical disc degeneration, unspecified cervical region: Secondary | ICD-10-CM | POA: Diagnosis not present

## 2015-10-22 DIAGNOSIS — M546 Pain in thoracic spine: Secondary | ICD-10-CM | POA: Diagnosis not present

## 2015-10-22 DIAGNOSIS — M5136 Other intervertebral disc degeneration, lumbar region: Secondary | ICD-10-CM | POA: Diagnosis not present

## 2015-10-22 DIAGNOSIS — M9902 Segmental and somatic dysfunction of thoracic region: Secondary | ICD-10-CM | POA: Diagnosis not present

## 2015-10-22 DIAGNOSIS — M9903 Segmental and somatic dysfunction of lumbar region: Secondary | ICD-10-CM | POA: Diagnosis not present

## 2015-10-23 DIAGNOSIS — G4733 Obstructive sleep apnea (adult) (pediatric): Secondary | ICD-10-CM | POA: Diagnosis not present

## 2015-10-24 DIAGNOSIS — M545 Low back pain: Secondary | ICD-10-CM | POA: Diagnosis not present

## 2015-10-24 DIAGNOSIS — M5030 Other cervical disc degeneration, unspecified cervical region: Secondary | ICD-10-CM | POA: Diagnosis not present

## 2015-10-24 DIAGNOSIS — M546 Pain in thoracic spine: Secondary | ICD-10-CM | POA: Diagnosis not present

## 2015-10-24 DIAGNOSIS — M9901 Segmental and somatic dysfunction of cervical region: Secondary | ICD-10-CM | POA: Diagnosis not present

## 2015-10-24 DIAGNOSIS — M9902 Segmental and somatic dysfunction of thoracic region: Secondary | ICD-10-CM | POA: Diagnosis not present

## 2015-10-24 DIAGNOSIS — M5134 Other intervertebral disc degeneration, thoracic region: Secondary | ICD-10-CM | POA: Diagnosis not present

## 2015-10-24 DIAGNOSIS — M542 Cervicalgia: Secondary | ICD-10-CM | POA: Diagnosis not present

## 2015-10-24 DIAGNOSIS — M5136 Other intervertebral disc degeneration, lumbar region: Secondary | ICD-10-CM | POA: Diagnosis not present

## 2015-10-24 DIAGNOSIS — M9903 Segmental and somatic dysfunction of lumbar region: Secondary | ICD-10-CM | POA: Diagnosis not present

## 2015-10-29 DIAGNOSIS — M5030 Other cervical disc degeneration, unspecified cervical region: Secondary | ICD-10-CM | POA: Diagnosis not present

## 2015-10-29 DIAGNOSIS — M546 Pain in thoracic spine: Secondary | ICD-10-CM | POA: Diagnosis not present

## 2015-10-29 DIAGNOSIS — M5136 Other intervertebral disc degeneration, lumbar region: Secondary | ICD-10-CM | POA: Diagnosis not present

## 2015-10-29 DIAGNOSIS — M9901 Segmental and somatic dysfunction of cervical region: Secondary | ICD-10-CM | POA: Diagnosis not present

## 2015-10-29 DIAGNOSIS — M9902 Segmental and somatic dysfunction of thoracic region: Secondary | ICD-10-CM | POA: Diagnosis not present

## 2015-10-29 DIAGNOSIS — M545 Low back pain: Secondary | ICD-10-CM | POA: Diagnosis not present

## 2015-10-29 DIAGNOSIS — M5134 Other intervertebral disc degeneration, thoracic region: Secondary | ICD-10-CM | POA: Diagnosis not present

## 2015-10-29 DIAGNOSIS — M9903 Segmental and somatic dysfunction of lumbar region: Secondary | ICD-10-CM | POA: Diagnosis not present

## 2015-10-29 DIAGNOSIS — M542 Cervicalgia: Secondary | ICD-10-CM | POA: Diagnosis not present

## 2015-11-13 DIAGNOSIS — G244 Idiopathic orofacial dystonia: Secondary | ICD-10-CM | POA: Diagnosis not present

## 2015-11-13 DIAGNOSIS — M1990 Unspecified osteoarthritis, unspecified site: Secondary | ICD-10-CM | POA: Diagnosis not present

## 2015-11-13 DIAGNOSIS — M109 Gout, unspecified: Secondary | ICD-10-CM | POA: Diagnosis not present

## 2015-11-13 DIAGNOSIS — N400 Enlarged prostate without lower urinary tract symptoms: Secondary | ICD-10-CM | POA: Diagnosis not present

## 2015-11-13 DIAGNOSIS — G4733 Obstructive sleep apnea (adult) (pediatric): Secondary | ICD-10-CM | POA: Diagnosis not present

## 2015-11-13 DIAGNOSIS — K589 Irritable bowel syndrome without diarrhea: Secondary | ICD-10-CM | POA: Diagnosis not present

## 2015-11-13 DIAGNOSIS — Z87898 Personal history of other specified conditions: Secondary | ICD-10-CM | POA: Diagnosis not present

## 2015-11-13 DIAGNOSIS — Z791 Long term (current) use of non-steroidal anti-inflammatories (NSAID): Secondary | ICD-10-CM | POA: Diagnosis not present

## 2015-11-13 DIAGNOSIS — I10 Essential (primary) hypertension: Secondary | ICD-10-CM | POA: Diagnosis not present

## 2015-11-13 DIAGNOSIS — E782 Mixed hyperlipidemia: Secondary | ICD-10-CM | POA: Diagnosis not present

## 2015-11-13 DIAGNOSIS — K219 Gastro-esophageal reflux disease without esophagitis: Secondary | ICD-10-CM | POA: Diagnosis not present

## 2015-11-21 DIAGNOSIS — G245 Blepharospasm: Secondary | ICD-10-CM | POA: Diagnosis not present

## 2015-11-27 DIAGNOSIS — M5030 Other cervical disc degeneration, unspecified cervical region: Secondary | ICD-10-CM | POA: Diagnosis not present

## 2015-11-27 DIAGNOSIS — M9903 Segmental and somatic dysfunction of lumbar region: Secondary | ICD-10-CM | POA: Diagnosis not present

## 2015-11-27 DIAGNOSIS — M5134 Other intervertebral disc degeneration, thoracic region: Secondary | ICD-10-CM | POA: Diagnosis not present

## 2015-11-27 DIAGNOSIS — M545 Low back pain: Secondary | ICD-10-CM | POA: Diagnosis not present

## 2015-11-27 DIAGNOSIS — M9901 Segmental and somatic dysfunction of cervical region: Secondary | ICD-10-CM | POA: Diagnosis not present

## 2015-11-27 DIAGNOSIS — M5136 Other intervertebral disc degeneration, lumbar region: Secondary | ICD-10-CM | POA: Diagnosis not present

## 2015-11-27 DIAGNOSIS — M546 Pain in thoracic spine: Secondary | ICD-10-CM | POA: Diagnosis not present

## 2015-11-27 DIAGNOSIS — M542 Cervicalgia: Secondary | ICD-10-CM | POA: Diagnosis not present

## 2015-11-27 DIAGNOSIS — M9902 Segmental and somatic dysfunction of thoracic region: Secondary | ICD-10-CM | POA: Diagnosis not present

## 2015-11-30 DIAGNOSIS — M9903 Segmental and somatic dysfunction of lumbar region: Secondary | ICD-10-CM | POA: Diagnosis not present

## 2015-11-30 DIAGNOSIS — M9901 Segmental and somatic dysfunction of cervical region: Secondary | ICD-10-CM | POA: Diagnosis not present

## 2015-11-30 DIAGNOSIS — M542 Cervicalgia: Secondary | ICD-10-CM | POA: Diagnosis not present

## 2015-11-30 DIAGNOSIS — M9902 Segmental and somatic dysfunction of thoracic region: Secondary | ICD-10-CM | POA: Diagnosis not present

## 2015-11-30 DIAGNOSIS — M545 Low back pain: Secondary | ICD-10-CM | POA: Diagnosis not present

## 2015-11-30 DIAGNOSIS — M546 Pain in thoracic spine: Secondary | ICD-10-CM | POA: Diagnosis not present

## 2015-11-30 DIAGNOSIS — M5030 Other cervical disc degeneration, unspecified cervical region: Secondary | ICD-10-CM | POA: Diagnosis not present

## 2015-11-30 DIAGNOSIS — M5136 Other intervertebral disc degeneration, lumbar region: Secondary | ICD-10-CM | POA: Diagnosis not present

## 2015-11-30 DIAGNOSIS — M5134 Other intervertebral disc degeneration, thoracic region: Secondary | ICD-10-CM | POA: Diagnosis not present

## 2016-01-31 DIAGNOSIS — G244 Idiopathic orofacial dystonia: Secondary | ICD-10-CM | POA: Diagnosis not present

## 2016-01-31 DIAGNOSIS — G243 Spasmodic torticollis: Secondary | ICD-10-CM | POA: Diagnosis not present

## 2016-02-05 DIAGNOSIS — G245 Blepharospasm: Secondary | ICD-10-CM | POA: Diagnosis not present

## 2016-04-01 DIAGNOSIS — Z85828 Personal history of other malignant neoplasm of skin: Secondary | ICD-10-CM | POA: Diagnosis not present

## 2016-04-01 DIAGNOSIS — L853 Xerosis cutis: Secondary | ICD-10-CM | POA: Diagnosis not present

## 2016-04-01 DIAGNOSIS — D1801 Hemangioma of skin and subcutaneous tissue: Secondary | ICD-10-CM | POA: Diagnosis not present

## 2016-04-01 DIAGNOSIS — D2372 Other benign neoplasm of skin of left lower limb, including hip: Secondary | ICD-10-CM | POA: Diagnosis not present

## 2016-04-01 DIAGNOSIS — L905 Scar conditions and fibrosis of skin: Secondary | ICD-10-CM | POA: Diagnosis not present

## 2016-04-01 DIAGNOSIS — L821 Other seborrheic keratosis: Secondary | ICD-10-CM | POA: Diagnosis not present

## 2016-04-02 DIAGNOSIS — H2513 Age-related nuclear cataract, bilateral: Secondary | ICD-10-CM | POA: Diagnosis not present

## 2016-04-19 DIAGNOSIS — Z23 Encounter for immunization: Secondary | ICD-10-CM | POA: Diagnosis not present

## 2016-04-30 DIAGNOSIS — G245 Blepharospasm: Secondary | ICD-10-CM | POA: Diagnosis not present

## 2016-05-19 DIAGNOSIS — G244 Idiopathic orofacial dystonia: Secondary | ICD-10-CM | POA: Diagnosis not present

## 2016-05-19 DIAGNOSIS — G4733 Obstructive sleep apnea (adult) (pediatric): Secondary | ICD-10-CM | POA: Diagnosis not present

## 2016-05-19 DIAGNOSIS — Z125 Encounter for screening for malignant neoplasm of prostate: Secondary | ICD-10-CM | POA: Diagnosis not present

## 2016-05-19 DIAGNOSIS — E669 Obesity, unspecified: Secondary | ICD-10-CM | POA: Diagnosis not present

## 2016-05-19 DIAGNOSIS — M170 Bilateral primary osteoarthritis of knee: Secondary | ICD-10-CM | POA: Diagnosis not present

## 2016-05-19 DIAGNOSIS — M109 Gout, unspecified: Secondary | ICD-10-CM | POA: Diagnosis not present

## 2016-05-19 DIAGNOSIS — E782 Mixed hyperlipidemia: Secondary | ICD-10-CM | POA: Diagnosis not present

## 2016-05-19 DIAGNOSIS — Z87898 Personal history of other specified conditions: Secondary | ICD-10-CM | POA: Diagnosis not present

## 2016-05-19 DIAGNOSIS — R143 Flatulence: Secondary | ICD-10-CM | POA: Diagnosis not present

## 2016-05-19 DIAGNOSIS — N400 Enlarged prostate without lower urinary tract symptoms: Secondary | ICD-10-CM | POA: Diagnosis not present

## 2016-05-19 DIAGNOSIS — K219 Gastro-esophageal reflux disease without esophagitis: Secondary | ICD-10-CM | POA: Diagnosis not present

## 2016-05-19 DIAGNOSIS — Z791 Long term (current) use of non-steroidal anti-inflammatories (NSAID): Secondary | ICD-10-CM | POA: Diagnosis not present

## 2016-05-19 DIAGNOSIS — I10 Essential (primary) hypertension: Secondary | ICD-10-CM | POA: Diagnosis not present

## 2016-06-02 DIAGNOSIS — M5030 Other cervical disc degeneration, unspecified cervical region: Secondary | ICD-10-CM | POA: Diagnosis not present

## 2016-06-02 DIAGNOSIS — M5136 Other intervertebral disc degeneration, lumbar region: Secondary | ICD-10-CM | POA: Diagnosis not present

## 2016-06-02 DIAGNOSIS — M9901 Segmental and somatic dysfunction of cervical region: Secondary | ICD-10-CM | POA: Diagnosis not present

## 2016-06-02 DIAGNOSIS — M9902 Segmental and somatic dysfunction of thoracic region: Secondary | ICD-10-CM | POA: Diagnosis not present

## 2016-06-02 DIAGNOSIS — M546 Pain in thoracic spine: Secondary | ICD-10-CM | POA: Diagnosis not present

## 2016-06-02 DIAGNOSIS — M5134 Other intervertebral disc degeneration, thoracic region: Secondary | ICD-10-CM | POA: Diagnosis not present

## 2016-06-02 DIAGNOSIS — M9903 Segmental and somatic dysfunction of lumbar region: Secondary | ICD-10-CM | POA: Diagnosis not present

## 2016-06-02 DIAGNOSIS — M545 Low back pain: Secondary | ICD-10-CM | POA: Diagnosis not present

## 2016-06-02 DIAGNOSIS — M542 Cervicalgia: Secondary | ICD-10-CM | POA: Diagnosis not present

## 2016-06-04 DIAGNOSIS — M5134 Other intervertebral disc degeneration, thoracic region: Secondary | ICD-10-CM | POA: Diagnosis not present

## 2016-06-04 DIAGNOSIS — M545 Low back pain: Secondary | ICD-10-CM | POA: Diagnosis not present

## 2016-06-04 DIAGNOSIS — M5136 Other intervertebral disc degeneration, lumbar region: Secondary | ICD-10-CM | POA: Diagnosis not present

## 2016-06-04 DIAGNOSIS — M542 Cervicalgia: Secondary | ICD-10-CM | POA: Diagnosis not present

## 2016-06-04 DIAGNOSIS — M9903 Segmental and somatic dysfunction of lumbar region: Secondary | ICD-10-CM | POA: Diagnosis not present

## 2016-06-04 DIAGNOSIS — M5030 Other cervical disc degeneration, unspecified cervical region: Secondary | ICD-10-CM | POA: Diagnosis not present

## 2016-06-04 DIAGNOSIS — M9901 Segmental and somatic dysfunction of cervical region: Secondary | ICD-10-CM | POA: Diagnosis not present

## 2016-06-04 DIAGNOSIS — M546 Pain in thoracic spine: Secondary | ICD-10-CM | POA: Diagnosis not present

## 2016-06-04 DIAGNOSIS — M9902 Segmental and somatic dysfunction of thoracic region: Secondary | ICD-10-CM | POA: Diagnosis not present

## 2016-06-06 DIAGNOSIS — M9903 Segmental and somatic dysfunction of lumbar region: Secondary | ICD-10-CM | POA: Diagnosis not present

## 2016-06-06 DIAGNOSIS — M5136 Other intervertebral disc degeneration, lumbar region: Secondary | ICD-10-CM | POA: Diagnosis not present

## 2016-06-06 DIAGNOSIS — M542 Cervicalgia: Secondary | ICD-10-CM | POA: Diagnosis not present

## 2016-06-06 DIAGNOSIS — M5134 Other intervertebral disc degeneration, thoracic region: Secondary | ICD-10-CM | POA: Diagnosis not present

## 2016-06-06 DIAGNOSIS — M5030 Other cervical disc degeneration, unspecified cervical region: Secondary | ICD-10-CM | POA: Diagnosis not present

## 2016-06-06 DIAGNOSIS — M545 Low back pain: Secondary | ICD-10-CM | POA: Diagnosis not present

## 2016-06-06 DIAGNOSIS — M9902 Segmental and somatic dysfunction of thoracic region: Secondary | ICD-10-CM | POA: Diagnosis not present

## 2016-06-06 DIAGNOSIS — M9901 Segmental and somatic dysfunction of cervical region: Secondary | ICD-10-CM | POA: Diagnosis not present

## 2016-06-06 DIAGNOSIS — M546 Pain in thoracic spine: Secondary | ICD-10-CM | POA: Diagnosis not present

## 2016-06-09 DIAGNOSIS — M9903 Segmental and somatic dysfunction of lumbar region: Secondary | ICD-10-CM | POA: Diagnosis not present

## 2016-06-09 DIAGNOSIS — M9901 Segmental and somatic dysfunction of cervical region: Secondary | ICD-10-CM | POA: Diagnosis not present

## 2016-06-09 DIAGNOSIS — M5030 Other cervical disc degeneration, unspecified cervical region: Secondary | ICD-10-CM | POA: Diagnosis not present

## 2016-06-09 DIAGNOSIS — M5136 Other intervertebral disc degeneration, lumbar region: Secondary | ICD-10-CM | POA: Diagnosis not present

## 2016-06-09 DIAGNOSIS — M9902 Segmental and somatic dysfunction of thoracic region: Secondary | ICD-10-CM | POA: Diagnosis not present

## 2016-06-09 DIAGNOSIS — M5134 Other intervertebral disc degeneration, thoracic region: Secondary | ICD-10-CM | POA: Diagnosis not present

## 2016-06-09 DIAGNOSIS — M545 Low back pain: Secondary | ICD-10-CM | POA: Diagnosis not present

## 2016-06-09 DIAGNOSIS — M546 Pain in thoracic spine: Secondary | ICD-10-CM | POA: Diagnosis not present

## 2016-06-09 DIAGNOSIS — M542 Cervicalgia: Secondary | ICD-10-CM | POA: Diagnosis not present

## 2016-06-11 DIAGNOSIS — M545 Low back pain: Secondary | ICD-10-CM | POA: Diagnosis not present

## 2016-06-11 DIAGNOSIS — M9901 Segmental and somatic dysfunction of cervical region: Secondary | ICD-10-CM | POA: Diagnosis not present

## 2016-06-11 DIAGNOSIS — M5134 Other intervertebral disc degeneration, thoracic region: Secondary | ICD-10-CM | POA: Diagnosis not present

## 2016-06-11 DIAGNOSIS — M542 Cervicalgia: Secondary | ICD-10-CM | POA: Diagnosis not present

## 2016-06-11 DIAGNOSIS — M9903 Segmental and somatic dysfunction of lumbar region: Secondary | ICD-10-CM | POA: Diagnosis not present

## 2016-06-11 DIAGNOSIS — M9902 Segmental and somatic dysfunction of thoracic region: Secondary | ICD-10-CM | POA: Diagnosis not present

## 2016-06-11 DIAGNOSIS — M5030 Other cervical disc degeneration, unspecified cervical region: Secondary | ICD-10-CM | POA: Diagnosis not present

## 2016-06-11 DIAGNOSIS — M5136 Other intervertebral disc degeneration, lumbar region: Secondary | ICD-10-CM | POA: Diagnosis not present

## 2016-06-11 DIAGNOSIS — M546 Pain in thoracic spine: Secondary | ICD-10-CM | POA: Diagnosis not present

## 2016-06-19 DIAGNOSIS — G243 Spasmodic torticollis: Secondary | ICD-10-CM | POA: Diagnosis not present

## 2016-07-30 DIAGNOSIS — G245 Blepharospasm: Secondary | ICD-10-CM | POA: Diagnosis not present

## 2016-08-04 DIAGNOSIS — R140 Abdominal distension (gaseous): Secondary | ICD-10-CM | POA: Diagnosis not present

## 2016-08-04 DIAGNOSIS — Z86010 Personal history of colonic polyps: Secondary | ICD-10-CM | POA: Diagnosis not present

## 2016-08-04 DIAGNOSIS — K219 Gastro-esophageal reflux disease without esophagitis: Secondary | ICD-10-CM | POA: Diagnosis not present

## 2016-08-29 DIAGNOSIS — R05 Cough: Secondary | ICD-10-CM | POA: Diagnosis not present

## 2016-08-29 DIAGNOSIS — J3089 Other allergic rhinitis: Secondary | ICD-10-CM | POA: Diagnosis not present

## 2016-08-29 DIAGNOSIS — J301 Allergic rhinitis due to pollen: Secondary | ICD-10-CM | POA: Diagnosis not present

## 2016-08-29 DIAGNOSIS — H1045 Other chronic allergic conjunctivitis: Secondary | ICD-10-CM | POA: Diagnosis not present

## 2016-10-23 DIAGNOSIS — G4733 Obstructive sleep apnea (adult) (pediatric): Secondary | ICD-10-CM | POA: Diagnosis not present

## 2016-10-28 DIAGNOSIS — G245 Blepharospasm: Secondary | ICD-10-CM | POA: Diagnosis not present

## 2016-11-20 DIAGNOSIS — G243 Spasmodic torticollis: Secondary | ICD-10-CM | POA: Diagnosis not present

## 2016-11-20 DIAGNOSIS — G244 Idiopathic orofacial dystonia: Secondary | ICD-10-CM | POA: Diagnosis not present

## 2016-11-20 DIAGNOSIS — G248 Other dystonia: Secondary | ICD-10-CM | POA: Diagnosis not present

## 2016-12-10 DIAGNOSIS — G244 Idiopathic orofacial dystonia: Secondary | ICD-10-CM | POA: Diagnosis not present

## 2016-12-10 DIAGNOSIS — G479 Sleep disorder, unspecified: Secondary | ICD-10-CM | POA: Diagnosis not present

## 2016-12-10 DIAGNOSIS — I10 Essential (primary) hypertension: Secondary | ICD-10-CM | POA: Diagnosis not present

## 2016-12-10 DIAGNOSIS — E782 Mixed hyperlipidemia: Secondary | ICD-10-CM | POA: Diagnosis not present

## 2016-12-10 DIAGNOSIS — N400 Enlarged prostate without lower urinary tract symptoms: Secondary | ICD-10-CM | POA: Diagnosis not present

## 2016-12-10 DIAGNOSIS — M170 Bilateral primary osteoarthritis of knee: Secondary | ICD-10-CM | POA: Diagnosis not present

## 2016-12-10 DIAGNOSIS — M109 Gout, unspecified: Secondary | ICD-10-CM | POA: Diagnosis not present

## 2016-12-10 DIAGNOSIS — K219 Gastro-esophageal reflux disease without esophagitis: Secondary | ICD-10-CM | POA: Diagnosis not present

## 2016-12-10 DIAGNOSIS — G4733 Obstructive sleep apnea (adult) (pediatric): Secondary | ICD-10-CM | POA: Diagnosis not present

## 2016-12-24 ENCOUNTER — Encounter: Payer: Self-pay | Admitting: Interventional Cardiology

## 2017-01-05 DIAGNOSIS — Z7189 Other specified counseling: Secondary | ICD-10-CM | POA: Diagnosis not present

## 2017-01-05 DIAGNOSIS — M109 Gout, unspecified: Secondary | ICD-10-CM | POA: Diagnosis not present

## 2017-01-05 DIAGNOSIS — E782 Mixed hyperlipidemia: Secondary | ICD-10-CM | POA: Diagnosis not present

## 2017-01-05 DIAGNOSIS — G244 Idiopathic orofacial dystonia: Secondary | ICD-10-CM | POA: Diagnosis not present

## 2017-01-05 DIAGNOSIS — Z0000 Encounter for general adult medical examination without abnormal findings: Secondary | ICD-10-CM | POA: Diagnosis not present

## 2017-01-05 DIAGNOSIS — K219 Gastro-esophageal reflux disease without esophagitis: Secondary | ICD-10-CM | POA: Diagnosis not present

## 2017-01-05 DIAGNOSIS — Z6835 Body mass index (BMI) 35.0-35.9, adult: Secondary | ICD-10-CM | POA: Diagnosis not present

## 2017-01-05 DIAGNOSIS — G4733 Obstructive sleep apnea (adult) (pediatric): Secondary | ICD-10-CM | POA: Diagnosis not present

## 2017-01-05 DIAGNOSIS — Z87898 Personal history of other specified conditions: Secondary | ICD-10-CM | POA: Diagnosis not present

## 2017-01-05 DIAGNOSIS — I10 Essential (primary) hypertension: Secondary | ICD-10-CM | POA: Diagnosis not present

## 2017-01-05 DIAGNOSIS — N400 Enlarged prostate without lower urinary tract symptoms: Secondary | ICD-10-CM | POA: Diagnosis not present

## 2017-01-08 DIAGNOSIS — I25111 Atherosclerotic heart disease of native coronary artery with angina pectoris with documented spasm: Secondary | ICD-10-CM | POA: Insufficient documentation

## 2017-01-08 NOTE — Progress Notes (Signed)
Cardiology Office Note    Date:  01/09/2017   ID:  John Novak 1946-01-16, MRN [id number]  PCP:  John Caraway, MD  Cardiologist: John Grooms, MD   Chief Complaint  Patient presents with  . Chest Pain    History of Present Illness:  John Novak is a 70 y.o. male with history of coronary atherosclerosis documented to be nonobstructive by catheterization in 2009,, hypertension, hyperlipidemia, who is referred to Dr. Theadore Novak for evaluation of dyspnea and chest pain.  Pleasant gentleman still working in his own [employer]. Physically active although unable to exercise due to bilateral osteoarthritis of the knees. He has no difficulty with dyspnea, orthopnea, ankle swelling, or effort/exercise-induced chest discomfort. He has never been a smoker. Used to be a distance runner. No palpitations or other complaints.  He has sleep apnea. He uses C Pap and occasionally awakens at night. When he awakens, if he takes a deep breath there is soreness along the left parasternal area that is positional and resolves within seconds. This discomfort is never been exertion related. He doesn't have them when he is up and moving about or working in his [employer].  Known abnormal EKG with bundle and right axis deviation. Prior workup in 2009 included myocardial perfusion imaging which led to cardiac catheterization which demonstrated the findings noted below. He has no peripheral edema or evidence of right heart failure on exam. I am not able to find any prior echocardiogram.  Past Medical History:  Diagnosis Date  . Anxiety   . Arthritis    knees  . Blepharospasm    bilateral, hx. "Meige syndrome" nerve issue- follow -up John Novak with Botox injections  . Coronary atherosclerosis   . Diverticulosis    no problems  . Gout   . Hyperlipidemia   . Hypertension   . Sleep apnea    automatic    Past Surgical History:  Procedure Laterality Date  . BACK SURGERY     lower back  . BROW LIFT AND BLEPHAROPLASTY Bilateral   . CARDIAC CATHETERIZATION     negative for significant findings  . INSERTION OF MESH N/A 06/23/2014   Procedure: INSERTION OF MESH;  Surgeon: John Boston, MD;  Location: [facility];  Service: General;  Laterality: N/A;  . KNEE ARTHROSCOPY Bilateral    bilateral  . SHOULDER ARTHROSCOPY W/ ROTATOR CUFF REPAIR Left   . VENTRAL HERNIA REPAIR N/A 06/23/2014   Procedure: LAPAROSCOPIC VENTRAL WALL HERNIA REPAIR;  Surgeon: John Boston, MD;  Location: [facility];  Service: General;  Laterality: N/A;    Current Medications: Outpatient Medications Prior to Visit  Medication Sig Dispense Refill  . allopurinol (ZYLOPRIM) 300 MG tablet Take 300 mg by mouth daily.    [name] aspirin 325 MG tablet Take 325 mg by mouth every other day.     . Bepotastine Besilate (BEPREVE) 1.5 % SOLN Place 1 drop into both eyes daily.    . celecoxib (CELEBREX) 200 MG capsule Take 200 mg by mouth daily as needed for mild pain.     . clonazePAM (KLONOPIN) 0.5 MG tablet Take 0.5 mg by mouth 2 (two) times daily as needed for anxiety.    [name] dexlansoprazole (DEXILANT) 60 MG capsule Take 60 mg by mouth daily.    . diclofenac sodium (VOLTAREN) 1 % GEL Apply 1 application topically daily as needed (for pain).     . fexofenadine (ALLEGRA) 60 MG tablet Take 60 mg by mouth daily as needed  for allergies or rhinitis.    . fluticasone (FLONASE) 50 MCG/ACT nasal spray Place 1 spray into both nostrils 2 (two) times daily.    . Influenza vac split quadrivalent PF (FLUARIX) 0.5 ML injection Inject 0.5 mLs into the muscle once.    [name] lisinopril (PRINIVIL,ZESTRIL) 40 MG tablet Take 40 mg by mouth every morning.     [name] LORazepam (ATIVAN) 0.5 MG tablet Take 0.5-1 mg by mouth 3 (three) times daily. Takes 1mg  in the morning, 0.5mg  at lunch, and 0.5mg  in the evening    . Olopatadine HCl (PATANASE) 0.6 % SOLN Place 2 puffs into both nostrils 2 (two) times daily.    . OnabotulinumtoxinA (BOTOX IJ) Inject  100-200 Units as directed. Every 11 weeks patient gets 100 units around eyes. Every 22 weeks pt gets 200 units in face and neck  At John Novak    . OVER THE COUNTER MEDICATION Take 1 tablet by mouth 2 (two) times daily. lipitropic Vitamin    . OVER THE COUNTER MEDICATION Take 1 tablet by mouth 3 (three) times daily. Gout cure    . Probiotic Product (PROBIOTIC DAILY) CAPS Take 1 capsule by mouth daily with breakfast.    . Propylene Glycol (SYSTANE BALANCE) 0.6 % SOLN Place 1 drop into both eyes every 4 (four) hours as needed (for dry eyes).    . simvastatin (ZOCOR) 80 MG tablet Take 80 mg by mouth at bedtime.     . tamsulosin (FLOMAX) 0.4 MG CAPS capsule Take 0.4 mg by mouth daily after supper.    . zolpidem (AMBIEN) 10 MG tablet Take 10 mg by mouth at bedtime as needed for sleep.    . Brompheniramine-Pseudoeph (BROMALINE PO) Take 1 capsule by mouth 3 (three) times daily.    [name] oxyCODONE (OXY IR/ROXICODONE) 5 MG immediate release tablet Take 1-2 tablets (5-10 mg total) by mouth every 4 (four) hours as needed for moderate pain, severe pain or breakthrough pain. 50 tablet 0  . ranitidine (ZANTAC) 150 MG tablet Take 150 mg by mouth daily.      No facility-administered medications prior to visit.      Allergies:   Hydrocodone; Latex; and Penicillins   Social History   Social History  . Marital status: Married    Spouse name: N/A  . Number of children: N/A  . Years of education: N/A   Social History Main Topics  . Smoking status: Never Smoker  . Smokeless tobacco: Never Used  . Alcohol use No  . Drug use: No  . Sexual activity: Yes   Other Topics Concern  . None   Social History Narrative  . None     Family History:  The patient's Family history is unknown by patient.   ROS:   Please see the history of present illness.    Bilateral knee arthritis that limits activity. He snores and has been diagnosed with sleep apnea. History of anxiety, back discomfort,  difficulty with waking up at night.  All other systems reviewed and are negative.   PHYSICAL EXAM:   VS:  BP 132/74 (BP Location: Left Arm)   Pulse 62   Ht 5\' 11"  (1.803 m)   Wt 247 lb (112 kg)   BMI 34.45 kg/m    GEN: Well nourished, well developed, in no acute distress  HEENT: normal  Neck: no JVD, carotid bruits, or masses Cardiac: RRR; no murmurs, rubs, or gallops,no edema  Respiratory:  clear to auscultation bilaterally, normal work of breathing  GI: soft, nontender, nondistended, + BS MS: no deformity or atrophy  Skin: warm and dry, no rash Neuro:  Alert and Oriented x 3, Strength and sensation are intact Psych: euthymic mood, full affect  Wt Readings from Last 3 Encounters:  01/09/17 247 lb (112 kg)  06/23/14 232 lb 8 oz (105.5 kg)  06/21/14 232 lb 8 oz (105.5 kg)      Studies/Labs Reviewed:   EKG:  EKG  Normal sinus rhythm, right bundle branch block, right axis deviation 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  Recent Labs: No results found for requested labs within last 8760 hours.   Lipid Panel No results found for: CHOL, TRIG, HDL, CHOLHDL, VLDL, LDLCALC, LDLDIRECT  Additional studies/ records that were reviewed today include:  Cardiac catheterization 2009: CONCLUSIONS:  1. Atherosclerosis with nonobstructive coronaries.  There is      calcification and plaque involving the distal left main, the      proximal and mid left anterior descending, the mid circumflex, and      the proximal and mid right coronary artery.  No significant      obstructions are noted.  The vessels are widely patent.  2. Normal left ventricular function.  3. False positive nuclear study.   ASSESSMENT:    1. Intercostal pain   2. Essential hypertension   3. Other hyperlipidemia   4. Coronary artery disease involving native coronary artery of native heart without angina pectoris      PLAN:  In order of problems listed above:  1. The current chest pain complaint  is clearly musculoskeletal. No evaluation is needed. 2. Blood pressure is under great control. No particular change in therapy is needed. Target blood pressure less than 140/90 mmHg. 3. Lipids are excellent with most recent LDL of 74. This is very good control and is working on his major cardiac risk factor. 4. He is previously documented to have nonobstructive coronary disease and is doing quite well at this time. No symptoms suggestive anginal/ischemic complaints. Risk modification is excellent. Would continue the same.  Plans clinical observation at this time. No further evaluation is needed. We discussed exertional symptoms representing myocardial ischemia so he will have some insight about when to call if he has concerns.  Medication Adjustments/Labs and Tests Ordered: Current medicines are reviewed at length with the patient today.  Concerns regarding medicines are outlined above.  Medication changes, Labs and Tests ordered today are listed in the Patient Instructions below. Patient Instructions  Medication Instructions:  None  Labwork: None  Testing/Procedures: None  Follow-Up: Your physician recommends that you schedule a follow-up appointment as needed with Dr. Tamala Julian.   Any Other Special Instructions Will Be Listed Below (If Applicable).  Your physician discussed the importance of regular exercise and recommended that you start or continue a regular exercise program for good health. Call our office if you notice any chest symptoms when you are exerting yourself (i.e. Pain, shortness of breath, tightness, etc)   If you need a refill on your cardiac medications before your next appointment, please call your pharmacy.      Signed, John Grooms, MD  01/09/2017 9:25 AM    [facility] [place], [place], [place]  [address] Phone: [phone]; Fax: [phone]

## 2017-01-09 ENCOUNTER — Ambulatory Visit (INDEPENDENT_AMBULATORY_CARE_PROVIDER_SITE_OTHER): Payer: Medicare Other | Admitting: Interventional Cardiology

## 2017-01-09 ENCOUNTER — Encounter: Payer: Self-pay | Admitting: Interventional Cardiology

## 2017-01-09 VITALS — BP 132/74 | HR 62 | Ht 71.0 in | Wt 247.0 lb

## 2017-01-09 DIAGNOSIS — E7849 Other hyperlipidemia: Secondary | ICD-10-CM

## 2017-01-09 DIAGNOSIS — E784 Other hyperlipidemia: Secondary | ICD-10-CM

## 2017-01-09 DIAGNOSIS — R0782 Intercostal pain: Secondary | ICD-10-CM | POA: Diagnosis not present

## 2017-01-09 DIAGNOSIS — I10 Essential (primary) hypertension: Secondary | ICD-10-CM | POA: Diagnosis not present

## 2017-01-09 DIAGNOSIS — I2510 Atherosclerotic heart disease of native coronary artery without angina pectoris: Secondary | ICD-10-CM | POA: Diagnosis not present

## 2017-01-09 NOTE — Patient Instructions (Signed)
Medication Instructions:  None  Labwork: None  Testing/Procedures: None  Follow-Up: Your physician recommends that you schedule a follow-up appointment as needed with Dr. Tamala Julian.   Any Other Special Instructions Will Be Listed Below (If Applicable).  Your physician discussed the importance of regular exercise and recommended that you start or continue a regular exercise program for good health. Call our office if you notice any chest symptoms when you are exerting yourself (i.e. Pain, shortness of breath, tightness, etc)   If you need a refill on your cardiac medications before your next appointment, please call your pharmacy.

## 2017-01-13 DIAGNOSIS — G245 Blepharospasm: Secondary | ICD-10-CM | POA: Diagnosis not present

## 2017-04-07 DIAGNOSIS — G245 Blepharospasm: Secondary | ICD-10-CM | POA: Diagnosis not present

## 2017-04-09 DIAGNOSIS — G245 Blepharospasm: Secondary | ICD-10-CM | POA: Diagnosis not present

## 2017-04-24 DIAGNOSIS — Z23 Encounter for immunization: Secondary | ICD-10-CM | POA: Diagnosis not present

## 2017-05-18 DIAGNOSIS — Z85828 Personal history of other malignant neoplasm of skin: Secondary | ICD-10-CM | POA: Diagnosis not present

## 2017-05-18 DIAGNOSIS — L570 Actinic keratosis: Secondary | ICD-10-CM | POA: Diagnosis not present

## 2017-05-18 DIAGNOSIS — D1801 Hemangioma of skin and subcutaneous tissue: Secondary | ICD-10-CM | POA: Diagnosis not present

## 2017-05-18 DIAGNOSIS — L821 Other seborrheic keratosis: Secondary | ICD-10-CM | POA: Diagnosis not present

## 2017-05-21 DIAGNOSIS — G243 Spasmodic torticollis: Secondary | ICD-10-CM | POA: Diagnosis not present

## 2017-05-21 DIAGNOSIS — G244 Idiopathic orofacial dystonia: Secondary | ICD-10-CM | POA: Diagnosis not present

## 2017-06-02 DIAGNOSIS — I10 Essential (primary) hypertension: Secondary | ICD-10-CM | POA: Diagnosis not present

## 2017-06-02 DIAGNOSIS — M170 Bilateral primary osteoarthritis of knee: Secondary | ICD-10-CM | POA: Diagnosis not present

## 2017-06-02 DIAGNOSIS — N400 Enlarged prostate without lower urinary tract symptoms: Secondary | ICD-10-CM | POA: Diagnosis not present

## 2017-06-02 DIAGNOSIS — Z87898 Personal history of other specified conditions: Secondary | ICD-10-CM | POA: Diagnosis not present

## 2017-06-02 DIAGNOSIS — Z86010 Personal history of colonic polyps: Secondary | ICD-10-CM | POA: Diagnosis not present

## 2017-06-02 DIAGNOSIS — R7301 Impaired fasting glucose: Secondary | ICD-10-CM | POA: Diagnosis not present

## 2017-06-02 DIAGNOSIS — G4733 Obstructive sleep apnea (adult) (pediatric): Secondary | ICD-10-CM | POA: Diagnosis not present

## 2017-06-02 DIAGNOSIS — M109 Gout, unspecified: Secondary | ICD-10-CM | POA: Diagnosis not present

## 2017-06-02 DIAGNOSIS — G244 Idiopathic orofacial dystonia: Secondary | ICD-10-CM | POA: Diagnosis not present

## 2017-06-02 DIAGNOSIS — K219 Gastro-esophageal reflux disease without esophagitis: Secondary | ICD-10-CM | POA: Diagnosis not present

## 2017-06-02 DIAGNOSIS — E782 Mixed hyperlipidemia: Secondary | ICD-10-CM | POA: Diagnosis not present

## 2017-06-23 DIAGNOSIS — G245 Blepharospasm: Secondary | ICD-10-CM | POA: Diagnosis not present

## 2017-08-25 DIAGNOSIS — H1045 Other chronic allergic conjunctivitis: Secondary | ICD-10-CM | POA: Diagnosis not present

## 2017-08-25 DIAGNOSIS — J3089 Other allergic rhinitis: Secondary | ICD-10-CM | POA: Diagnosis not present

## 2017-08-25 DIAGNOSIS — J301 Allergic rhinitis due to pollen: Secondary | ICD-10-CM | POA: Diagnosis not present

## 2017-08-25 DIAGNOSIS — R05 Cough: Secondary | ICD-10-CM | POA: Diagnosis not present

## 2017-09-08 DIAGNOSIS — G245 Blepharospasm: Secondary | ICD-10-CM | POA: Diagnosis not present

## 2017-09-15 DIAGNOSIS — K219 Gastro-esophageal reflux disease without esophagitis: Secondary | ICD-10-CM | POA: Diagnosis not present

## 2017-09-15 DIAGNOSIS — Z86010 Personal history of colonic polyps: Secondary | ICD-10-CM | POA: Diagnosis not present

## 2017-09-22 DIAGNOSIS — K648 Other hemorrhoids: Secondary | ICD-10-CM | POA: Diagnosis not present

## 2017-09-22 DIAGNOSIS — Z86010 Personal history of colonic polyps: Secondary | ICD-10-CM | POA: Diagnosis not present

## 2017-09-22 DIAGNOSIS — D126 Benign neoplasm of colon, unspecified: Secondary | ICD-10-CM | POA: Diagnosis not present

## 2017-09-25 DIAGNOSIS — D126 Benign neoplasm of colon, unspecified: Secondary | ICD-10-CM | POA: Diagnosis not present

## 2017-10-22 DIAGNOSIS — Z9229 Personal history of other drug therapy: Secondary | ICD-10-CM | POA: Diagnosis not present

## 2017-10-22 DIAGNOSIS — G243 Spasmodic torticollis: Secondary | ICD-10-CM | POA: Diagnosis not present

## 2017-10-22 DIAGNOSIS — G244 Idiopathic orofacial dystonia: Secondary | ICD-10-CM | POA: Diagnosis not present

## 2017-10-26 DIAGNOSIS — G4733 Obstructive sleep apnea (adult) (pediatric): Secondary | ICD-10-CM | POA: Diagnosis not present

## 2017-11-24 DIAGNOSIS — G245 Blepharospasm: Secondary | ICD-10-CM | POA: Diagnosis not present

## 2018-01-27 DIAGNOSIS — Z87898 Personal history of other specified conditions: Secondary | ICD-10-CM | POA: Diagnosis not present

## 2018-01-27 DIAGNOSIS — I10 Essential (primary) hypertension: Secondary | ICD-10-CM | POA: Diagnosis not present

## 2018-01-27 DIAGNOSIS — R34 Anuria and oliguria: Secondary | ICD-10-CM | POA: Diagnosis not present

## 2018-01-27 DIAGNOSIS — N400 Enlarged prostate without lower urinary tract symptoms: Secondary | ICD-10-CM | POA: Diagnosis not present

## 2018-01-27 DIAGNOSIS — G4733 Obstructive sleep apnea (adult) (pediatric): Secondary | ICD-10-CM | POA: Diagnosis not present

## 2018-01-27 DIAGNOSIS — M170 Bilateral primary osteoarthritis of knee: Secondary | ICD-10-CM | POA: Diagnosis not present

## 2018-01-27 DIAGNOSIS — K219 Gastro-esophageal reflux disease without esophagitis: Secondary | ICD-10-CM | POA: Diagnosis not present

## 2018-01-27 DIAGNOSIS — R7301 Impaired fasting glucose: Secondary | ICD-10-CM | POA: Diagnosis not present

## 2018-01-27 DIAGNOSIS — E782 Mixed hyperlipidemia: Secondary | ICD-10-CM | POA: Diagnosis not present

## 2018-01-27 DIAGNOSIS — M109 Gout, unspecified: Secondary | ICD-10-CM | POA: Diagnosis not present

## 2018-01-27 DIAGNOSIS — G244 Idiopathic orofacial dystonia: Secondary | ICD-10-CM | POA: Diagnosis not present

## 2018-02-16 DIAGNOSIS — G245 Blepharospasm: Secondary | ICD-10-CM | POA: Diagnosis not present

## 2018-02-18 DIAGNOSIS — M9901 Segmental and somatic dysfunction of cervical region: Secondary | ICD-10-CM | POA: Diagnosis not present

## 2018-02-18 DIAGNOSIS — M9903 Segmental and somatic dysfunction of lumbar region: Secondary | ICD-10-CM | POA: Diagnosis not present

## 2018-02-18 DIAGNOSIS — M542 Cervicalgia: Secondary | ICD-10-CM | POA: Diagnosis not present

## 2018-02-18 DIAGNOSIS — M9902 Segmental and somatic dysfunction of thoracic region: Secondary | ICD-10-CM | POA: Diagnosis not present

## 2018-02-22 DIAGNOSIS — M542 Cervicalgia: Secondary | ICD-10-CM | POA: Diagnosis not present

## 2018-02-22 DIAGNOSIS — M9901 Segmental and somatic dysfunction of cervical region: Secondary | ICD-10-CM | POA: Diagnosis not present

## 2018-02-22 DIAGNOSIS — M9902 Segmental and somatic dysfunction of thoracic region: Secondary | ICD-10-CM | POA: Diagnosis not present

## 2018-02-22 DIAGNOSIS — M9903 Segmental and somatic dysfunction of lumbar region: Secondary | ICD-10-CM | POA: Diagnosis not present

## 2018-02-24 DIAGNOSIS — M5136 Other intervertebral disc degeneration, lumbar region: Secondary | ICD-10-CM | POA: Diagnosis not present

## 2018-02-24 DIAGNOSIS — M9901 Segmental and somatic dysfunction of cervical region: Secondary | ICD-10-CM | POA: Diagnosis not present

## 2018-02-24 DIAGNOSIS — M50322 Other cervical disc degeneration at C5-C6 level: Secondary | ICD-10-CM | POA: Diagnosis not present

## 2018-02-24 DIAGNOSIS — M9903 Segmental and somatic dysfunction of lumbar region: Secondary | ICD-10-CM | POA: Diagnosis not present

## 2018-02-26 DIAGNOSIS — M9901 Segmental and somatic dysfunction of cervical region: Secondary | ICD-10-CM | POA: Diagnosis not present

## 2018-02-26 DIAGNOSIS — M542 Cervicalgia: Secondary | ICD-10-CM | POA: Diagnosis not present

## 2018-02-26 DIAGNOSIS — M9902 Segmental and somatic dysfunction of thoracic region: Secondary | ICD-10-CM | POA: Diagnosis not present

## 2018-02-26 DIAGNOSIS — M9903 Segmental and somatic dysfunction of lumbar region: Secondary | ICD-10-CM | POA: Diagnosis not present

## 2018-03-01 DIAGNOSIS — M9902 Segmental and somatic dysfunction of thoracic region: Secondary | ICD-10-CM | POA: Diagnosis not present

## 2018-03-01 DIAGNOSIS — M9903 Segmental and somatic dysfunction of lumbar region: Secondary | ICD-10-CM | POA: Diagnosis not present

## 2018-03-01 DIAGNOSIS — M9901 Segmental and somatic dysfunction of cervical region: Secondary | ICD-10-CM | POA: Diagnosis not present

## 2018-03-01 DIAGNOSIS — M542 Cervicalgia: Secondary | ICD-10-CM | POA: Diagnosis not present

## 2018-03-05 DIAGNOSIS — M542 Cervicalgia: Secondary | ICD-10-CM | POA: Diagnosis not present

## 2018-03-05 DIAGNOSIS — M9902 Segmental and somatic dysfunction of thoracic region: Secondary | ICD-10-CM | POA: Diagnosis not present

## 2018-03-05 DIAGNOSIS — M9903 Segmental and somatic dysfunction of lumbar region: Secondary | ICD-10-CM | POA: Diagnosis not present

## 2018-03-05 DIAGNOSIS — M9901 Segmental and somatic dysfunction of cervical region: Secondary | ICD-10-CM | POA: Diagnosis not present

## 2018-03-09 DIAGNOSIS — M542 Cervicalgia: Secondary | ICD-10-CM | POA: Diagnosis not present

## 2018-03-09 DIAGNOSIS — M9901 Segmental and somatic dysfunction of cervical region: Secondary | ICD-10-CM | POA: Diagnosis not present

## 2018-03-09 DIAGNOSIS — M9903 Segmental and somatic dysfunction of lumbar region: Secondary | ICD-10-CM | POA: Diagnosis not present

## 2018-03-09 DIAGNOSIS — M9902 Segmental and somatic dysfunction of thoracic region: Secondary | ICD-10-CM | POA: Diagnosis not present

## 2018-03-12 DIAGNOSIS — M9902 Segmental and somatic dysfunction of thoracic region: Secondary | ICD-10-CM | POA: Diagnosis not present

## 2018-03-12 DIAGNOSIS — M542 Cervicalgia: Secondary | ICD-10-CM | POA: Diagnosis not present

## 2018-03-12 DIAGNOSIS — M9903 Segmental and somatic dysfunction of lumbar region: Secondary | ICD-10-CM | POA: Diagnosis not present

## 2018-03-12 DIAGNOSIS — M9901 Segmental and somatic dysfunction of cervical region: Secondary | ICD-10-CM | POA: Diagnosis not present

## 2018-03-16 DIAGNOSIS — G244 Idiopathic orofacial dystonia: Secondary | ICD-10-CM | POA: Diagnosis not present

## 2018-03-16 DIAGNOSIS — Z09 Encounter for follow-up examination after completed treatment for conditions other than malignant neoplasm: Secondary | ICD-10-CM | POA: Diagnosis not present

## 2018-03-16 DIAGNOSIS — G243 Spasmodic torticollis: Secondary | ICD-10-CM | POA: Diagnosis not present

## 2018-03-16 DIAGNOSIS — Z9229 Personal history of other drug therapy: Secondary | ICD-10-CM | POA: Diagnosis not present

## 2018-04-14 ENCOUNTER — Telehealth: Payer: Self-pay | Admitting: Interventional Cardiology

## 2018-04-14 NOTE — Telephone Encounter (Signed)
Agree. Thanks

## 2018-04-14 NOTE — Telephone Encounter (Signed)
New message   Pt c/o swelling: STAT is pt has developed SOB within 24 hours  1) How much weight have you gained and in what time span? No   2) If swelling, where is the swelling located? Above the ankle on both ankles   3) Are you currently taking a fluid pill? No   4) Are you currently SOB? Yes, but patient states that it is not from the swelling  5) Do you have a log of your daily weights (if so, list)?no   6) Have you gained 3 pounds in a day or 5 pounds in a week?no   7) Have you traveled recently? No   Scheduled patient for an appointment on October 28th with Richardson Dopp.

## 2018-04-14 NOTE — Telephone Encounter (Signed)
Called patient back about his message. Patient complaining of his whole inside of the left foot and ankle swelling and some swelling on his right foot around the ankle, and some SOB with activity at times for about a month. Patient stated he was advised by his chiropractor to call his cardiologist. Patient has an appointment with Richardson Dopp PA in a few weeks. Encouraged patient to call his PCP also. Informed patient the importance of keeping a low salt diet and elevating his legs when sitting. Will forward to Dr. Tamala Julian for further advisement.

## 2018-05-03 ENCOUNTER — Telehealth: Payer: Self-pay | Admitting: *Deleted

## 2018-05-03 ENCOUNTER — Encounter: Payer: Self-pay | Admitting: Physician Assistant

## 2018-05-03 ENCOUNTER — Encounter (INDEPENDENT_AMBULATORY_CARE_PROVIDER_SITE_OTHER): Payer: Self-pay

## 2018-05-03 ENCOUNTER — Ambulatory Visit (INDEPENDENT_AMBULATORY_CARE_PROVIDER_SITE_OTHER): Payer: Medicare Other | Admitting: Physician Assistant

## 2018-05-03 VITALS — BP 140/78 | HR 64 | Ht 71.0 in | Wt 259.0 lb

## 2018-05-03 DIAGNOSIS — E7849 Other hyperlipidemia: Secondary | ICD-10-CM

## 2018-05-03 DIAGNOSIS — R0602 Shortness of breath: Secondary | ICD-10-CM

## 2018-05-03 DIAGNOSIS — I10 Essential (primary) hypertension: Secondary | ICD-10-CM

## 2018-05-03 DIAGNOSIS — I2510 Atherosclerotic heart disease of native coronary artery without angina pectoris: Secondary | ICD-10-CM

## 2018-05-03 LAB — PRO B NATRIURETIC PEPTIDE: NT-PRO BNP: 41 pg/mL (ref 0–376)

## 2018-05-03 MED ORDER — ASPIRIN EC 81 MG PO TBEC: 81.0000 mg | DELAYED_RELEASE_TABLET | Freq: Every day | ORAL | Status: DC

## 2018-05-03 NOTE — Telephone Encounter (Signed)
-----   Message from Liliane Shi, [place] sent at 05/03/2018  5:14 PM EDT ----- BNP normal.  Recommendations:  - Continue current medications and follow up as planned.  Richardson Dopp, PA-C    05/03/2018 5:14 PM

## 2018-05-03 NOTE — Progress Notes (Signed)
Cardiology Office Note:    Date:  05/03/2018   ID:  John Novak, DOB 1946-06-04, MRN [id number]  PCP:  Cari Caraway, MD  Cardiologist:  Sinclair Grooms, MD   Electrophysiologist:  None   Referring MD: Cari Caraway, MD   Chief Complaint  Patient presents with  . Shortness of Breath    Weight gain, swelling     History of Present Illness:    John Novak is a 72 y.o. male with nonobstructive coronary artery disease by cardiac catheterization in 2009, hypertension, hyperlipidemia.  He was last seen by Dr. Tamala Julian in 2018 with symptoms consistent with musculoskeletal chest pain.  As needed follow-up was recommended.     Mr. Warrell returns for evaluation of weight gain, lower extremity swelling and shortness of breath.  His son had a stroke a few months ago.  He feels that his diet changed related to the stress of the situation.  Since July, he has gained about 16 pounds.  He has noted that his blood pressure is running higher than it normally runs.  He has swelling in his feet that typically improves after lying supine.  He sleeps on an incline chronically.  He uses CPAP at night.  He denies paroxysmal nocturnal dyspnea.  He denies exertional shortness of breath, chest pain or syncope.  He denies any coughing or wheezing.John Novak  He denies any bleeding issues.  Prior CV studies:   The following studies were reviewed today:  Cardiac catheterization 05/2008 EF 60 LM distal 30 LAD mid 30 LCx 30-40 RCA proximal-mid 20-30  Nuclear stress test 05/2008 Fixed inferobasal defect, redistribution in the lateral wall-question diaphragmatic attenuation; EF 61  Past Medical History:  Diagnosis Date  . Anxiety   . Arthritis    knees  . Blepharospasm    bilateral, hx. "Meige syndrome" nerve issue- follow -up [facility] with Botox injections  . Coronary atherosclerosis   . Diverticulosis    no problems  . Gout   . Hyperlipidemia   . Hypertension   . Sleep apnea    automatic    Surgical Hx: The patient  has a past surgical history that includes Cardiac catheterization; Back surgery; Knee arthroscopy (Bilateral); Shoulder arthroscopy w/ rotator cuff repair (Left); Brow lift and blepharoplasty (Bilateral); Ventral hernia repair (N/A, 06/23/2014); and Insertion of mesh (N/A, 06/23/2014).   Current Medications: Current Meds  Medication Sig  . allopurinol (ZYLOPRIM) 300 MG tablet Take 300 mg by mouth daily.  John Novak aspirin 325 MG tablet Take 325 mg by mouth every other day.   . Bepotastine Besilate (BEPREVE) 1.5 % SOLN Place 1 drop into both eyes daily.  . celecoxib (CELEBREX) 200 MG capsule Take 200 mg by mouth daily as needed for mild pain.   . clonazePAM (KLONOPIN) 0.5 MG tablet Take 0.5 mg by mouth 2 (two) times daily as needed for anxiety.  John Novak dexlansoprazole (DEXILANT) 60 MG capsule Take 60 mg by mouth daily.  . diclofenac sodium (VOLTAREN) 1 % GEL Apply 1 application topically daily as needed (for pain).   . fexofenadine (ALLEGRA) 60 MG tablet Take 60 mg by mouth daily as needed for allergies or rhinitis.  . fluticasone (FLONASE) 50 MCG/ACT nasal spray Place 1 spray into both nostrils 2 (two) times daily.  . GuaiFENesin (MUCINEX PO) Take 1 capsule by mouth daily.  . Influenza vac split quadrivalent PF (FLUARIX) 0.5 ML injection Inject 0.5 mLs into the muscle once.  John Novak lisinopril (PRINIVIL,ZESTRIL) 40 MG tablet Take 40 mg  by mouth every morning.   John Novak LORazepam (ATIVAN) 0.5 MG tablet Take 0.5-1 mg by mouth 3 (three) times daily. Takes 1mg  in the morning, 0.5mg  at lunch, and 0.5mg  in the evening  . Olopatadine HCl (PATANASE) 0.6 % SOLN Place 2 puffs into both nostrils 2 (two) times daily.  . OnabotulinumtoxinA (BOTOX IJ) Inject 100-200 Units as directed. Every 11 weeks patient gets 100 units around eyes. Every 22 weeks pt gets 200 units in face and neck  At [facility]  . OVER THE COUNTER MEDICATION Take 1 tablet by mouth 2 (two) times daily. lipitropic  Vitamin  . OVER THE COUNTER MEDICATION Take 1 tablet by mouth 3 (three) times daily. Gout cure  . Probiotic Product (PROBIOTIC DAILY) CAPS Take 1 capsule by mouth daily with breakfast.  . Propylene Glycol (SYSTANE BALANCE) 0.6 % SOLN Place 1 drop into both eyes every 4 (four) hours as needed (for dry eyes).  . simvastatin (ZOCOR) 80 MG tablet Take 80 mg by mouth at bedtime.   . tamsulosin (FLOMAX) 0.4 MG CAPS capsule Take 0.4 mg by mouth daily after supper.  . zolpidem (AMBIEN) 10 MG tablet Take 10 mg by mouth at bedtime as needed for sleep.     Allergies:   Hydrocodone; Latex; and Penicillins   Social History   Tobacco Use  . Smoking status: Never Smoker  . Smokeless tobacco: Never Used  Substance Use Topics  . Alcohol use: No  . Drug use: No     Family Hx: The patient's Family history is unknown by patient.  ROS:   Please see the history of present illness.    Review of Systems  Constitution: Positive for weight gain.  Eyes: Positive for visual disturbance.  Cardiovascular: Positive for leg swelling.  Respiratory: Positive for shortness of breath.    All other systems reviewed and are negative.   EKGs/Labs/Other Test Reviewed:    EKG:  EKG is  ordered today.  The ekg ordered today demonstrates normal sinus rhythm, heart rate 60, first-degree AV block (PR 210 ms), right axis deviation, QTC 460 ms, similar to prior tracing  Recent Labs: No results found for requested labs within last 8760 hours.   Recent Lipid Panel No results found for: CHOL, TRIG, HDL, CHOLHDL, LDLCALC, LDLDIRECT   From [facility]: Cholesterol, total 144.000 06/02/2017 HDL 42.000 06/02/2017 LDL 65.000 06/02/2017 Triglycerides 184.000 06/02/2017 A1C 6.000 01/27/2018 Hemoglobin 15.600 05/19/2016 Creatinine, Serum 1.240 01/27/2018 Potassium 4.700 01/27/2018 ALT (SGPT) 15.000 06/02/2017   Physical Exam:    VS:  BP 140/78   Pulse 64   Ht 5\' 11"  (1.803 m)   Wt 259 lb (117.5 kg)   SpO2 96%   BMI  36.12 kg/m     Wt Readings from Last 3 Encounters:  05/03/18 259 lb (117.5 kg)  01/09/17 247 lb (112 kg)  06/23/14 232 lb 8 oz (105.5 kg)     Physical Exam  Constitutional: He is oriented to person, place, and time. He appears well-developed and well-nourished. No distress.  HENT:  Head: Normocephalic and atraumatic.  Eyes: No scleral icterus.  Neck: No JVD present. No thyromegaly present.  Cardiovascular: Normal rate and regular rhythm.  No murmur heard. Pulmonary/Chest: Effort normal. He has no rales.  Abdominal: Soft. He exhibits no distension.  Musculoskeletal: He exhibits edema (trace ankle edema on the R).  Lymphadenopathy:    He has no cervical adenopathy.  Neurological: He is alert and oriented to person, place, and time.  Skin:  Skin is warm and dry.  Psychiatric: He has a normal mood and affect.    ASSESSMENT & PLAN:    Shortness of breath Overall, I suspect that his symptoms are related to weight gain from overeating caused by stress from his son's recent illness.  I will obtain an echocardiogram to rule out structural heart disease.  I will obtain a BNP.  If his BNP is significantly elevated, I will place him on diuretics.  We also discussed changing his diet to help with weight loss.    Coronary artery disease  Nonobstructive coronary artery disease by prior cardiac catheterization.  He denies anginal symptoms.  We discussed the risks and benefits of taking high-dose aspirin.  He has agreed to decrease his aspirin 81 mg daily.  Continue statin therapy.  Other hyperlipidemia Managed by primary care.  Continue statin therapy.  Essential hypertension Blood pressure is running above target.  We discussed changing his diet to help with weight loss.  If his blood pressure continues to run above target, he will contact us for further adjustments in his medical therapy.   Dispo:  Return in about 1 year (around 05/04/2019) for Routine Follow Up w/ Dr. Tamala Julian.    Medication Adjustments/Labs and Tests Ordered: Current medicines are reviewed at length with the patient today.  Concerns regarding medicines are outlined above.  Tests Ordered: Orders Placed This Encounter  Procedures  . EKG 12-Lead  . ECHOCARDIOGRAM COMPLETE   Medication Changes: No orders of the defined types were placed in this encounter.   Signed, Richardson Dopp, PA-C  05/03/2018 11:04 AM    [facility] [place], [place], [place]  [address] Phone: [phone]; Fax: [phone]

## 2018-05-03 NOTE — Patient Instructions (Addendum)
Medication Instructions:  1. DECREASE ASPIRIN TO 81 MG DAILY  If you need a refill on your cardiac medications before your next appointment, please call your pharmacy.   Lab work: TODAY PRO BNP If you have labs (blood work) drawn today and your tests are completely normal, you will receive your results only by: [name] MyChart Message (if you have MyChart) OR . A paper copy in the mail If you have any lab test that is abnormal or we need to change your treatment, we will call you to review the results.  Testing/Procedures: Your physician has requested that you have an echocardiogram. Echocardiography is a painless test that uses sound waves to create images of your heart. It provides your doctor with information about the size and shape of your heart and how well your heart's chambers and valves are working. This procedure takes approximately one hour. There are no restrictions for this procedure.    Follow-Up: At [facility], you and your health needs are our priority.  As part of our continuing mission to provide you with exceptional heart care, we have created designated Provider Care Teams.  These Care Teams include your primary Cardiologist (physician) and Advanced Practice Providers (APPs -  Physician Assistants and Nurse Practitioners) who all work together to provide you with the care you need, when you need it. You will need a follow up appointment in 1 years.  Please call our office 2 months in advance to schedule this appointment.  You may see Sinclair Grooms, MD or one of the following Advanced Practice Providers on your designated Care Team:   Truitt Merle, NP Cecilie Kicks, NP . Kathyrn Drown, NP  Any Other Special Instructions Will Be Listed Below (If Applicable).  DASH Eating Plan DASH stands for "Dietary Approaches to Stop Hypertension." The DASH eating plan is a healthy eating plan that has been shown to reduce high blood pressure (hypertension). It may also reduce your  risk for type 2 diabetes, heart disease, and stroke. The DASH eating plan may also help with weight loss. What are tips for following this plan? General guidelines  Avoid eating more than 2,300 mg (milligrams) of salt (sodium) a day. If you have hypertension, you may need to reduce your sodium intake to 1,500 mg a day.  Limit alcohol intake to no more than 1 drink a day for nonpregnant women and 2 drinks a day for men. One drink equals 12 oz of beer, 5 oz of wine, or 1 oz of hard liquor.  Work with your health care provider to maintain a healthy body weight or to lose weight. Ask what an ideal weight is for you.  Get at least 30 minutes of exercise that causes your heart to beat faster (aerobic exercise) most days of the week. Activities may include walking, swimming, or biking.  Work with your health care provider or diet and nutrition specialist (dietitian) to adjust your eating plan to your individual calorie needs. Reading food labels  Check food labels for the amount of sodium per serving. Choose foods with less than 5 percent of the Daily Value of sodium. Generally, foods with less than 300 mg of sodium per serving fit into this eating plan.  To find whole grains, look for the word "whole" as the first word in the ingredient list. Shopping  Buy products labeled as "low-sodium" or "no salt added."  Buy fresh foods. Avoid canned foods and premade or frozen meals. Cooking  Avoid adding salt when  cooking. Use salt-free seasonings or herbs instead of table salt or sea salt. Check with your health care provider or pharmacist before using salt substitutes.  Do not fry foods. Cook foods using healthy methods such as baking, boiling, grilling, and broiling instead.  Cook with heart-healthy oils, such as olive, canola, soybean, or sunflower oil. Meal planning   Eat a balanced diet that includes: ? 5 or more servings of fruits and vegetables each day. At each meal, try to fill half of  your plate with fruits and vegetables. ? Up to 6-8 servings of whole grains each day. ? Less than 6 oz of lean meat, poultry, or fish each day. A 3-oz serving of meat is about the same size as a deck of cards. One egg equals 1 oz. ? 2 servings of low-fat dairy each day. ? A serving of nuts, seeds, or beans 5 times each week. ? Heart-healthy fats. Healthy fats called Omega-3 fatty acids are found in foods such as flaxseeds and coldwater fish, like sardines, salmon, and mackerel.  Limit how much you eat of the following: ? Canned or prepackaged foods. ? Food that is high in trans fat, such as fried foods. ? Food that is high in saturated fat, such as fatty meat. ? Sweets, desserts, sugary drinks, and other foods with added sugar. ? Full-fat dairy products.  Do not salt foods before eating.  Try to eat at least 2 vegetarian meals each week.  Eat more home-cooked food and less restaurant, buffet, and fast food.  When eating at a restaurant, ask that your food be prepared with less salt or no salt, if possible. What foods are recommended? The items listed may not be a complete list. Talk with your dietitian about what dietary choices are best for you. Grains Whole-grain or whole-wheat bread. Whole-grain or whole-wheat pasta. Brown rice. [place] Morrow. Bulgur. Whole-grain and low-sodium cereals. Pita bread. Low-fat, low-sodium crackers. Whole-wheat flour tortillas. Vegetables Fresh or frozen vegetables (raw, steamed, roasted, or grilled). Low-sodium or reduced-sodium tomato and vegetable juice. Low-sodium or reduced-sodium tomato sauce and tomato paste. Low-sodium or reduced-sodium canned vegetables. Fruits All fresh, dried, or frozen fruit. Canned fruit in natural juice (without added sugar). Meat and other protein foods Skinless chicken or [place]. Ground chicken or [place]. Pork with fat trimmed off. Fish and seafood. Egg whites. Dried beans, peas, or lentils. Unsalted nuts, nut butters,  and seeds. Unsalted canned beans. Lean cuts of beef with fat trimmed off. Low-sodium, lean deli meat. Dairy Low-fat (1%) or fat-free (skim) milk. Fat-free, low-fat, or reduced-fat cheeses. Nonfat, low-sodium ricotta or cottage cheese. Low-fat or nonfat yogurt. Low-fat, low-sodium cheese. Fats and oils Soft margarine without trans fats. Vegetable oil. Low-fat, reduced-fat, or light mayonnaise and salad dressings (reduced-sodium). Canola, safflower, olive, soybean, and sunflower oils. Avocado. Seasoning and other foods Herbs. Spices. Seasoning mixes without salt. Unsalted popcorn and pretzels. Fat-free sweets. What foods are not recommended? The items listed may not be a complete list. Talk with your dietitian about what dietary choices are best for you. Grains Baked goods made with fat, such as croissants, muffins, or some breads. Dry pasta or rice meal packs. Vegetables Creamed or fried vegetables. Vegetables in a cheese sauce. Regular canned vegetables (not low-sodium or reduced-sodium). Regular canned tomato sauce and paste (not low-sodium or reduced-sodium). Regular tomato and vegetable juice (not low-sodium or reduced-sodium). Angie Fava. Olives. Fruits Canned fruit in a light or heavy syrup. Fried fruit. Fruit in cream or butter sauce. Meat and other protein  foods Fatty cuts of meat. Ribs. Fried meat. Berniece Salines. Sausage. Bologna and other processed lunch meats. Salami. Fatback. Hotdogs. Bratwurst. Salted nuts and seeds. Canned beans with added salt. Canned or smoked fish. Whole eggs or egg yolks. Chicken or [place] with skin. Dairy Whole or 2% milk, cream, and half-and-half. Whole or full-fat cream cheese. Whole-fat or sweetened yogurt. Full-fat cheese. Nondairy creamers. Whipped toppings. Processed cheese and cheese spreads. Fats and oils Butter. Stick margarine. Lard. Shortening. Ghee. Bacon fat. Tropical oils, such as coconut, palm kernel, or palm oil. Seasoning and other foods Salted popcorn  and pretzels. Onion salt, garlic salt, seasoned salt, table salt, and sea salt. Worcestershire sauce. Tartar sauce. Barbecue sauce. Teriyaki sauce. Soy sauce, including reduced-sodium. Steak sauce. Canned and packaged gravies. Fish sauce. Oyster sauce. Cocktail sauce. Horseradish that you find on the shelf. Ketchup. Mustard. Meat flavorings and tenderizers. Bouillon cubes. Hot sauce and Tabasco sauce. Premade or packaged marinades. Premade or packaged taco seasonings. Relishes. Regular salad dressings. Where to find more information:  National Heart, Lung, and [place]: https://wilson-eaton.com/  American Heart Association: www.heart.org Summary  The DASH eating plan is a healthy eating plan that has been shown to reduce high blood pressure (hypertension). It may also reduce your risk for type 2 diabetes, heart disease, and stroke.  With the DASH eating plan, you should limit salt (sodium) intake to 2,300 mg a day. If you have hypertension, you may need to reduce your sodium intake to 1,500 mg a day.  When on the DASH eating plan, aim to eat more fresh fruits and vegetables, whole grains, lean proteins, low-fat dairy, and heart-healthy fats.  Work with your health care provider or diet and nutrition specialist (dietitian) to adjust your eating plan to your individual calorie needs. This information is not intended to replace advice given to you by your health care provider. Make sure you discuss any questions you have with your health care provider. Document Released: 06/12/2011 Document Revised: 06/16/2016 Document Reviewed: 06/16/2016 Elsevier Interactive Patient Education  [employer].

## 2018-05-04 DIAGNOSIS — G245 Blepharospasm: Secondary | ICD-10-CM | POA: Diagnosis not present

## 2018-05-13 ENCOUNTER — Ambulatory Visit (HOSPITAL_COMMUNITY): Payer: Medicare Other | Attending: Cardiovascular Disease

## 2018-05-13 ENCOUNTER — Encounter: Payer: Self-pay | Admitting: Physician Assistant

## 2018-05-13 ENCOUNTER — Other Ambulatory Visit: Payer: Self-pay

## 2018-05-13 DIAGNOSIS — R0602 Shortness of breath: Secondary | ICD-10-CM | POA: Diagnosis not present

## 2018-05-24 DIAGNOSIS — L821 Other seborrheic keratosis: Secondary | ICD-10-CM | POA: Diagnosis not present

## 2018-05-24 DIAGNOSIS — Z85828 Personal history of other malignant neoplasm of skin: Secondary | ICD-10-CM | POA: Diagnosis not present

## 2018-05-24 DIAGNOSIS — D1801 Hemangioma of skin and subcutaneous tissue: Secondary | ICD-10-CM | POA: Diagnosis not present

## 2018-07-21 DIAGNOSIS — G245 Blepharospasm: Secondary | ICD-10-CM | POA: Diagnosis not present

## 2018-08-10 DIAGNOSIS — G245 Blepharospasm: Secondary | ICD-10-CM | POA: Diagnosis not present

## 2018-08-10 DIAGNOSIS — G244 Idiopathic orofacial dystonia: Secondary | ICD-10-CM | POA: Diagnosis not present

## 2018-08-16 DIAGNOSIS — H04123 Dry eye syndrome of bilateral lacrimal glands: Secondary | ICD-10-CM | POA: Diagnosis not present

## 2018-08-16 DIAGNOSIS — G245 Blepharospasm: Secondary | ICD-10-CM | POA: Diagnosis not present

## 2018-08-16 DIAGNOSIS — H2513 Age-related nuclear cataract, bilateral: Secondary | ICD-10-CM | POA: Diagnosis not present

## 2018-08-20 DIAGNOSIS — R05 Cough: Secondary | ICD-10-CM | POA: Diagnosis not present

## 2018-08-20 DIAGNOSIS — H1045 Other chronic allergic conjunctivitis: Secondary | ICD-10-CM | POA: Diagnosis not present

## 2018-08-20 DIAGNOSIS — J3089 Other allergic rhinitis: Secondary | ICD-10-CM | POA: Diagnosis not present

## 2018-08-20 DIAGNOSIS — J301 Allergic rhinitis due to pollen: Secondary | ICD-10-CM | POA: Diagnosis not present

## 2018-08-24 DIAGNOSIS — M170 Bilateral primary osteoarthritis of knee: Secondary | ICD-10-CM | POA: Diagnosis not present

## 2018-08-24 DIAGNOSIS — K219 Gastro-esophageal reflux disease without esophagitis: Secondary | ICD-10-CM | POA: Diagnosis not present

## 2018-08-24 DIAGNOSIS — E782 Mixed hyperlipidemia: Secondary | ICD-10-CM | POA: Diagnosis not present

## 2018-08-24 DIAGNOSIS — Z1389 Encounter for screening for other disorder: Secondary | ICD-10-CM | POA: Diagnosis not present

## 2018-08-24 DIAGNOSIS — Z0000 Encounter for general adult medical examination without abnormal findings: Secondary | ICD-10-CM | POA: Diagnosis not present

## 2018-08-24 DIAGNOSIS — Z125 Encounter for screening for malignant neoplasm of prostate: Secondary | ICD-10-CM | POA: Diagnosis not present

## 2018-08-24 DIAGNOSIS — N400 Enlarged prostate without lower urinary tract symptoms: Secondary | ICD-10-CM | POA: Diagnosis not present

## 2018-08-24 DIAGNOSIS — R7301 Impaired fasting glucose: Secondary | ICD-10-CM | POA: Diagnosis not present

## 2018-08-24 DIAGNOSIS — Z87898 Personal history of other specified conditions: Secondary | ICD-10-CM | POA: Diagnosis not present

## 2018-08-24 DIAGNOSIS — I10 Essential (primary) hypertension: Secondary | ICD-10-CM | POA: Diagnosis not present

## 2018-08-24 DIAGNOSIS — M109 Gout, unspecified: Secondary | ICD-10-CM | POA: Diagnosis not present

## 2018-08-24 DIAGNOSIS — G4733 Obstructive sleep apnea (adult) (pediatric): Secondary | ICD-10-CM | POA: Diagnosis not present

## 2018-09-27 DIAGNOSIS — G245 Blepharospasm: Secondary | ICD-10-CM | POA: Diagnosis not present

## 2018-10-25 DIAGNOSIS — N400 Enlarged prostate without lower urinary tract symptoms: Secondary | ICD-10-CM | POA: Diagnosis not present

## 2018-10-25 DIAGNOSIS — M170 Bilateral primary osteoarthritis of knee: Secondary | ICD-10-CM | POA: Diagnosis not present

## 2018-10-25 DIAGNOSIS — E782 Mixed hyperlipidemia: Secondary | ICD-10-CM | POA: Diagnosis not present

## 2018-10-25 DIAGNOSIS — I10 Essential (primary) hypertension: Secondary | ICD-10-CM | POA: Diagnosis not present

## 2018-11-02 DIAGNOSIS — G4733 Obstructive sleep apnea (adult) (pediatric): Secondary | ICD-10-CM | POA: Diagnosis not present

## 2018-11-11 DIAGNOSIS — I10 Essential (primary) hypertension: Secondary | ICD-10-CM | POA: Diagnosis not present

## 2018-11-11 DIAGNOSIS — N400 Enlarged prostate without lower urinary tract symptoms: Secondary | ICD-10-CM | POA: Diagnosis not present

## 2018-11-11 DIAGNOSIS — M170 Bilateral primary osteoarthritis of knee: Secondary | ICD-10-CM | POA: Diagnosis not present

## 2018-11-11 DIAGNOSIS — E782 Mixed hyperlipidemia: Secondary | ICD-10-CM | POA: Diagnosis not present

## 2018-11-18 DIAGNOSIS — K317 Polyp of stomach and duodenum: Secondary | ICD-10-CM | POA: Diagnosis not present

## 2018-11-18 DIAGNOSIS — K219 Gastro-esophageal reflux disease without esophagitis: Secondary | ICD-10-CM | POA: Diagnosis not present

## 2018-11-18 DIAGNOSIS — Z86010 Personal history of colonic polyps: Secondary | ICD-10-CM | POA: Diagnosis not present

## 2018-12-20 DIAGNOSIS — R482 Apraxia: Secondary | ICD-10-CM | POA: Diagnosis not present

## 2018-12-20 DIAGNOSIS — G245 Blepharospasm: Secondary | ICD-10-CM | POA: Diagnosis not present

## 2018-12-23 DIAGNOSIS — M9901 Segmental and somatic dysfunction of cervical region: Secondary | ICD-10-CM | POA: Diagnosis not present

## 2018-12-23 DIAGNOSIS — M9902 Segmental and somatic dysfunction of thoracic region: Secondary | ICD-10-CM | POA: Diagnosis not present

## 2018-12-23 DIAGNOSIS — M9903 Segmental and somatic dysfunction of lumbar region: Secondary | ICD-10-CM | POA: Diagnosis not present

## 2018-12-23 DIAGNOSIS — M542 Cervicalgia: Secondary | ICD-10-CM | POA: Diagnosis not present

## 2018-12-27 DIAGNOSIS — M9902 Segmental and somatic dysfunction of thoracic region: Secondary | ICD-10-CM | POA: Diagnosis not present

## 2018-12-27 DIAGNOSIS — M9903 Segmental and somatic dysfunction of lumbar region: Secondary | ICD-10-CM | POA: Diagnosis not present

## 2018-12-27 DIAGNOSIS — M9901 Segmental and somatic dysfunction of cervical region: Secondary | ICD-10-CM | POA: Diagnosis not present

## 2018-12-27 DIAGNOSIS — M542 Cervicalgia: Secondary | ICD-10-CM | POA: Diagnosis not present

## 2019-01-11 DIAGNOSIS — G243 Spasmodic torticollis: Secondary | ICD-10-CM | POA: Diagnosis not present

## 2019-01-11 DIAGNOSIS — G244 Idiopathic orofacial dystonia: Secondary | ICD-10-CM | POA: Diagnosis not present

## 2019-01-11 DIAGNOSIS — G245 Blepharospasm: Secondary | ICD-10-CM | POA: Diagnosis not present

## 2019-01-11 DIAGNOSIS — M436 Torticollis: Secondary | ICD-10-CM | POA: Diagnosis not present

## 2019-01-28 DIAGNOSIS — M170 Bilateral primary osteoarthritis of knee: Secondary | ICD-10-CM | POA: Diagnosis not present

## 2019-01-28 DIAGNOSIS — E782 Mixed hyperlipidemia: Secondary | ICD-10-CM | POA: Diagnosis not present

## 2019-01-28 DIAGNOSIS — I10 Essential (primary) hypertension: Secondary | ICD-10-CM | POA: Diagnosis not present

## 2019-01-28 DIAGNOSIS — N400 Enlarged prostate without lower urinary tract symptoms: Secondary | ICD-10-CM | POA: Diagnosis not present

## 2019-03-09 DIAGNOSIS — G245 Blepharospasm: Secondary | ICD-10-CM | POA: Diagnosis not present

## 2019-03-11 DIAGNOSIS — R7301 Impaired fasting glucose: Secondary | ICD-10-CM | POA: Diagnosis not present

## 2019-03-17 DIAGNOSIS — Z23 Encounter for immunization: Secondary | ICD-10-CM | POA: Diagnosis not present

## 2019-03-21 DIAGNOSIS — D485 Neoplasm of uncertain behavior of skin: Secondary | ICD-10-CM | POA: Diagnosis not present

## 2019-03-21 DIAGNOSIS — D1801 Hemangioma of skin and subcutaneous tissue: Secondary | ICD-10-CM | POA: Diagnosis not present

## 2019-03-21 DIAGNOSIS — L821 Other seborrheic keratosis: Secondary | ICD-10-CM | POA: Diagnosis not present

## 2019-03-21 DIAGNOSIS — Z85828 Personal history of other malignant neoplasm of skin: Secondary | ICD-10-CM | POA: Diagnosis not present

## 2019-03-21 DIAGNOSIS — L570 Actinic keratosis: Secondary | ICD-10-CM | POA: Diagnosis not present

## 2019-04-01 DIAGNOSIS — R7303 Prediabetes: Secondary | ICD-10-CM | POA: Diagnosis not present

## 2019-04-01 DIAGNOSIS — K219 Gastro-esophageal reflux disease without esophagitis: Secondary | ICD-10-CM | POA: Diagnosis not present

## 2019-04-01 DIAGNOSIS — L97919 Non-pressure chronic ulcer of unspecified part of right lower leg with unspecified severity: Secondary | ICD-10-CM | POA: Diagnosis not present

## 2019-04-01 DIAGNOSIS — G244 Idiopathic orofacial dystonia: Secondary | ICD-10-CM | POA: Diagnosis not present

## 2019-04-01 DIAGNOSIS — E782 Mixed hyperlipidemia: Secondary | ICD-10-CM | POA: Diagnosis not present

## 2019-04-01 DIAGNOSIS — I10 Essential (primary) hypertension: Secondary | ICD-10-CM | POA: Diagnosis not present

## 2019-04-01 DIAGNOSIS — G479 Sleep disorder, unspecified: Secondary | ICD-10-CM | POA: Diagnosis not present

## 2019-04-01 DIAGNOSIS — M109 Gout, unspecified: Secondary | ICD-10-CM | POA: Diagnosis not present

## 2019-04-01 DIAGNOSIS — G4733 Obstructive sleep apnea (adult) (pediatric): Secondary | ICD-10-CM | POA: Diagnosis not present

## 2019-04-01 DIAGNOSIS — Z1321 Encounter for screening for nutritional disorder: Secondary | ICD-10-CM | POA: Diagnosis not present

## 2019-05-04 ENCOUNTER — Telehealth (INDEPENDENT_AMBULATORY_CARE_PROVIDER_SITE_OTHER): Payer: Medicare Other | Admitting: Physician Assistant

## 2019-05-04 ENCOUNTER — Other Ambulatory Visit: Payer: Self-pay

## 2019-05-04 ENCOUNTER — Encounter: Payer: Self-pay | Admitting: Physician Assistant

## 2019-05-04 VITALS — BP 136/76 | Ht 71.0 in | Wt 244.0 lb

## 2019-05-04 DIAGNOSIS — I2510 Atherosclerotic heart disease of native coronary artery without angina pectoris: Secondary | ICD-10-CM

## 2019-05-04 DIAGNOSIS — E7849 Other hyperlipidemia: Secondary | ICD-10-CM

## 2019-05-04 DIAGNOSIS — I10 Essential (primary) hypertension: Secondary | ICD-10-CM

## 2019-05-04 NOTE — Progress Notes (Signed)
Virtual Visit via Telephone Note   This visit type was conducted due to national recommendations for restrictions regarding the COVID-19 Pandemic (e.g. social distancing) in an effort to limit this patient's exposure and mitigate transmission in our community.  Due to his co-morbid illnesses, this patient is at least at moderate risk for complications without adequate follow up.  This format is felt to be most appropriate for this patient at this time.  The patient did not have access to video technology/had technical difficulties with video requiring transitioning to audio format only (telephone).  All issues noted in this document were discussed and addressed.  No physical exam could be performed with this format.  Please refer to the patient's chart for his  consent to telehealth for [facility].   Date:  05/04/2019   ID:  John, Novak September 02, 1945, MRN [id number]  Patient Location: Home Provider Location: Home  PCP:  Cari Caraway, MD  Cardiologist:  Sinclair Grooms, MD   Electrophysiologist:  None   Evaluation Performed:  Follow-Up Visit  Chief Complaint: CAD  History of Present Illness:    John Novak is a 73 y.o. male with:  Coronary artery disease   Non-obstructive CAD by cath in 2009 (dLM 30, mLAD 30, LCx 30-40, pRCA 20-30)  Hypertension   Hyperlipidemia   Chest pain   OSA  Gout  The patient was last seen in 04/2018.  Today, he notes he is doing well.  He has not had chest discomfort, shortness of breath, syncope.  He has not had orthopnea.  He still has some lower extremity swelling.  However, he has been dieting and has lost about 15 pounds since last year.  With this, his lower extremity swelling is improved.  He still has some swelling with prolonged standing from time to time.  The patient does not have symptoms concerning for COVID-19 infection (fever, chills, cough, or new shortness of breath).    Past Medical History:  Diagnosis Date  .  Anxiety   . Arthritis    knees  . Blepharospasm    bilateral, hx. "Meige syndrome" nerve issue- follow -up [facility] with Botox injections  . Coronary atherosclerosis   . Diverticulosis    no problems  . Echocardiogram    Echo 11/19: EF 60-65, no RWMA, Gr 1 DD  . Gout   . Hyperlipidemia   . Hypertension   . Sleep apnea    automatic   Past Surgical History:  Procedure Laterality Date  . BACK SURGERY     lower back  . BROW LIFT AND BLEPHAROPLASTY Bilateral   . CARDIAC CATHETERIZATION     negative for significant findings  . INSERTION OF MESH N/A 06/23/2014   Procedure: INSERTION OF MESH;  Surgeon: Michael Boston, MD;  Location: [facility];  Service: General;  Laterality: N/A;  . KNEE ARTHROSCOPY Bilateral    bilateral  . SHOULDER ARTHROSCOPY W/ ROTATOR CUFF REPAIR Left   . VENTRAL HERNIA REPAIR N/A 06/23/2014   Procedure: LAPAROSCOPIC VENTRAL WALL HERNIA REPAIR;  Surgeon: Michael Boston, MD;  Location: [facility];  Service: General;  Laterality: N/A;     Current Meds  Medication Sig  . allopurinol (ZYLOPRIM) 300 MG tablet Take 300 mg by mouth daily.  [name] aspirin EC 81 MG tablet Take 1 tablet (81 mg total) by mouth daily.  . celecoxib (CELEBREX) 200 MG capsule Take 200 mg by mouth daily as needed for mild pain.   . clonazePAM (KLONOPIN) 0.5 MG  tablet Take 0.5 mg by mouth 2 (two) times daily as needed for anxiety.  [name] dexlansoprazole (DEXILANT) 60 MG capsule Take 60 mg by mouth daily.  . diclofenac sodium (VOLTAREN) 1 % GEL Apply 1 application topically daily as needed (for pain).   . fexofenadine (ALLEGRA) 60 MG tablet Take 60 mg by mouth daily as needed for allergies or rhinitis.  . fluticasone (FLONASE) 50 MCG/ACT nasal spray Place 1 spray into both nostrils 2 (two) times daily.  . GuaiFENesin (MUCINEX PO) Take 1 capsule by mouth daily.  . Influenza vac split quadrivalent PF (FLUARIX) 0.5 ML injection Inject 0.5 mLs into the muscle once.  [name] lisinopril (PRINIVIL,ZESTRIL) 40 MG tablet Take 40  mg by mouth every morning.   [name] LORazepam (ATIVAN) 0.5 MG tablet Take 0.5-1 mg by mouth 3 (three) times daily. Takes 1mg  in the morning, 0.5mg  at lunch, and 0.5mg  in the evening  . OnabotulinumtoxinA (BOTOX IJ) Inject 100-200 Units as directed. Every 11 weeks patient gets 100 units around eyes. Every 22 weeks pt gets 200 units in face and neck  At [facility]  . OVER THE COUNTER MEDICATION Take 1 tablet by mouth 2 (two) times daily. lipitropic Vitamin  . OVER THE COUNTER MEDICATION Take 1 tablet by mouth 3 (three) times daily. Gout cure  . Probiotic Product (PROBIOTIC DAILY) CAPS Take 1 capsule by mouth daily with breakfast.  . Propylene Glycol (SYSTANE BALANCE) 0.6 % SOLN Place 1 drop into both eyes every 4 (four) hours as needed (for dry eyes).  . simvastatin (ZOCOR) 80 MG tablet Take 80 mg by mouth at bedtime.   . tamsulosin (FLOMAX) 0.4 MG CAPS capsule Take 0.4 mg by mouth daily after supper.  . zolpidem (AMBIEN) 10 MG tablet Take 10 mg by mouth at bedtime as needed for sleep.     Allergies:   Hydrocodone, Latex, and Penicillins   Social History   Tobacco Use  . Smoking status: Never Smoker  . Smokeless tobacco: Never Used  Substance Use Topics  . Alcohol use: No  . Drug use: No     Family Hx: The patient's Family history is unknown by patient.  ROS:   Please see the history of present illness.    All other systems reviewed and are negative.   Prior CV studies:   The following studies were reviewed today:  Echocardiogram 05/13/18 EF 60-65, no RWMA, Gr 1 DD  Cardiac catheterization 05/2008 EF 60 LM distal 30 LAD mid 30 LCx 30-40 RCA proximal-mid 20-30   Nuclear stress test 05/2008 Fixed inferobasal defect, redistribution in the lateral wall-question diaphragmatic attenuation; EF 61   Labs/Other Tests and Data Reviewed:    EKG:  No ECG reviewed.  Recent Labs: No results found for requested labs within last 8760 hours.   Recent Lipid Panel No  results found for: CHOL, TRIG, HDL, CHOLHDL, LDLCALC, LDLDIRECT      Wt Readings from Last 3 Encounters:  05/04/19 244 lb (110.7 kg)  05/03/18 259 lb (117.5 kg)  01/09/17 247 lb (112 kg)     Objective:    Vital Signs:  BP 136/76   Ht 5\' 11"  (1.803 m)   Wt 244 lb (110.7 kg)   BMI 34.03 kg/m    VITAL SIGNS:  reviewed GEN:  no acute distress RESPIRATORY:  No labored breathing NEURO:  Alert and oriented PSYCH:  Normal mood  ASSESSMENT & PLAN:    1. Coronary artery disease involving native coronary artery of native  heart without angina pectoris Nonobstructive coronary artery disease by cardiac catheterization in 2009.  He is currently doing well without symptoms of angina.  Continue aspirin, statin.  Follow-up in 1 year, in person.  2. Essential hypertension Blood pressure borderline controlled.  He has done a great job recently with losing weight.  I suspect his blood pressure will continue to improve.  Continue current therapy for now.  We discussed his goal blood pressure (<130/80).  He knows to contact us or his primary care physician if his blood pressure readings are consistently above this.  3. Other hyperlipidemia LDL optimal on most recent lab work.  Continue current Rx.     COVID-19 Education: The signs and symptoms of COVID-19 were discussed with the patient and how to seek care for testing (follow up with PCP or arrange E-visit).  The importance of social distancing was discussed today.  Time:   Today, I have spent 9 minutes with the patient with telehealth technology discussing the above problems.     Medication Adjustments/Labs and Tests Ordered: Current medicines are reviewed at length with the patient today.  Concerns regarding medicines are outlined above.   Tests Ordered: No orders of the defined types were placed in this encounter.   Medication Changes: No orders of the defined types were placed in this encounter.   Follow Up:  In Person in 1  year(s)  Signed, Richardson Dopp, PA-C  05/04/2019 4:20 PM    [facility]

## 2019-05-04 NOTE — Patient Instructions (Signed)
Medication Instructions:  Your physician recommends that you continue on your current medications as directed. Please refer to the Current Medication list given to you today.  *If you need a refill on your cardiac medications before your next appointment, please call your pharmacy*  Lab Work: None   If you have labs (blood work) drawn today and your tests are completely normal, you will receive your results only by: [name] MyChart Message (if you have MyChart) OR . A paper copy in the mail If you have any lab test that is abnormal or we need to change your treatment, we will call you to review the results.  Testing/Procedures: None   Follow-Up: At [facility], you and your health needs are our priority.  As part of our continuing mission to provide you with exceptional heart care, we have created designated Provider Care Teams.  These Care Teams include your primary Cardiologist (physician) and Advanced Practice Providers (APPs -  Physician Assistants and Nurse Practitioners) who all work together to provide you with the care you need, when you need it.  Your next appointment:   12 months  The format for your next appointment:   In Person  Provider:   Daneen Schick, MD or Richardson Dopp PA-C  Other Instructions

## 2019-05-11 DIAGNOSIS — M9901 Segmental and somatic dysfunction of cervical region: Secondary | ICD-10-CM | POA: Diagnosis not present

## 2019-05-11 DIAGNOSIS — M9902 Segmental and somatic dysfunction of thoracic region: Secondary | ICD-10-CM | POA: Diagnosis not present

## 2019-05-11 DIAGNOSIS — M542 Cervicalgia: Secondary | ICD-10-CM | POA: Diagnosis not present

## 2019-05-11 DIAGNOSIS — M9903 Segmental and somatic dysfunction of lumbar region: Secondary | ICD-10-CM | POA: Diagnosis not present

## 2019-05-13 DIAGNOSIS — M9903 Segmental and somatic dysfunction of lumbar region: Secondary | ICD-10-CM | POA: Diagnosis not present

## 2019-05-13 DIAGNOSIS — M542 Cervicalgia: Secondary | ICD-10-CM | POA: Diagnosis not present

## 2019-05-13 DIAGNOSIS — M9902 Segmental and somatic dysfunction of thoracic region: Secondary | ICD-10-CM | POA: Diagnosis not present

## 2019-05-13 DIAGNOSIS — M9901 Segmental and somatic dysfunction of cervical region: Secondary | ICD-10-CM | POA: Diagnosis not present

## 2019-05-16 DIAGNOSIS — Z85828 Personal history of other malignant neoplasm of skin: Secondary | ICD-10-CM | POA: Diagnosis not present

## 2019-05-16 DIAGNOSIS — D485 Neoplasm of uncertain behavior of skin: Secondary | ICD-10-CM | POA: Diagnosis not present

## 2019-05-16 DIAGNOSIS — L820 Inflamed seborrheic keratosis: Secondary | ICD-10-CM | POA: Diagnosis not present

## 2019-05-16 DIAGNOSIS — L821 Other seborrheic keratosis: Secondary | ICD-10-CM | POA: Diagnosis not present

## 2019-05-18 DIAGNOSIS — M542 Cervicalgia: Secondary | ICD-10-CM | POA: Diagnosis not present

## 2019-05-18 DIAGNOSIS — M9903 Segmental and somatic dysfunction of lumbar region: Secondary | ICD-10-CM | POA: Diagnosis not present

## 2019-05-18 DIAGNOSIS — M9901 Segmental and somatic dysfunction of cervical region: Secondary | ICD-10-CM | POA: Diagnosis not present

## 2019-05-18 DIAGNOSIS — M9902 Segmental and somatic dysfunction of thoracic region: Secondary | ICD-10-CM | POA: Diagnosis not present

## 2019-05-25 DIAGNOSIS — G245 Blepharospasm: Secondary | ICD-10-CM | POA: Diagnosis not present

## 2019-06-06 DIAGNOSIS — N400 Enlarged prostate without lower urinary tract symptoms: Secondary | ICD-10-CM | POA: Diagnosis not present

## 2019-06-06 DIAGNOSIS — M170 Bilateral primary osteoarthritis of knee: Secondary | ICD-10-CM | POA: Diagnosis not present

## 2019-06-06 DIAGNOSIS — I10 Essential (primary) hypertension: Secondary | ICD-10-CM | POA: Diagnosis not present

## 2019-06-06 DIAGNOSIS — E782 Mixed hyperlipidemia: Secondary | ICD-10-CM | POA: Diagnosis not present

## 2019-08-05 DIAGNOSIS — I10 Essential (primary) hypertension: Secondary | ICD-10-CM | POA: Diagnosis not present

## 2019-08-05 DIAGNOSIS — N400 Enlarged prostate without lower urinary tract symptoms: Secondary | ICD-10-CM | POA: Diagnosis not present

## 2019-08-05 DIAGNOSIS — E782 Mixed hyperlipidemia: Secondary | ICD-10-CM | POA: Diagnosis not present

## 2019-08-05 DIAGNOSIS — M170 Bilateral primary osteoarthritis of knee: Secondary | ICD-10-CM | POA: Diagnosis not present

## 2019-08-17 DIAGNOSIS — G245 Blepharospasm: Secondary | ICD-10-CM | POA: Diagnosis not present

## 2019-08-19 DIAGNOSIS — J301 Allergic rhinitis due to pollen: Secondary | ICD-10-CM | POA: Diagnosis not present

## 2019-08-19 DIAGNOSIS — H1045 Other chronic allergic conjunctivitis: Secondary | ICD-10-CM | POA: Diagnosis not present

## 2019-08-19 DIAGNOSIS — R05 Cough: Secondary | ICD-10-CM | POA: Diagnosis not present

## 2019-08-19 DIAGNOSIS — J3089 Other allergic rhinitis: Secondary | ICD-10-CM | POA: Diagnosis not present

## 2019-09-09 DIAGNOSIS — I10 Essential (primary) hypertension: Secondary | ICD-10-CM | POA: Diagnosis not present

## 2019-09-09 DIAGNOSIS — Z0000 Encounter for general adult medical examination without abnormal findings: Secondary | ICD-10-CM | POA: Diagnosis not present

## 2019-09-09 DIAGNOSIS — M109 Gout, unspecified: Secondary | ICD-10-CM | POA: Diagnosis not present

## 2019-09-09 DIAGNOSIS — E782 Mixed hyperlipidemia: Secondary | ICD-10-CM | POA: Diagnosis not present

## 2019-09-09 DIAGNOSIS — L97919 Non-pressure chronic ulcer of unspecified part of right lower leg with unspecified severity: Secondary | ICD-10-CM | POA: Diagnosis not present

## 2019-09-09 DIAGNOSIS — Z1389 Encounter for screening for other disorder: Secondary | ICD-10-CM | POA: Diagnosis not present

## 2019-09-09 DIAGNOSIS — Z1321 Encounter for screening for nutritional disorder: Secondary | ICD-10-CM | POA: Diagnosis not present

## 2019-09-09 DIAGNOSIS — M810 Age-related osteoporosis without current pathological fracture: Secondary | ICD-10-CM | POA: Diagnosis not present

## 2019-09-09 DIAGNOSIS — N400 Enlarged prostate without lower urinary tract symptoms: Secondary | ICD-10-CM | POA: Diagnosis not present

## 2019-09-09 DIAGNOSIS — K219 Gastro-esophageal reflux disease without esophagitis: Secondary | ICD-10-CM | POA: Diagnosis not present

## 2019-09-09 DIAGNOSIS — M170 Bilateral primary osteoarthritis of knee: Secondary | ICD-10-CM | POA: Diagnosis not present

## 2019-09-09 DIAGNOSIS — R7309 Other abnormal glucose: Secondary | ICD-10-CM | POA: Diagnosis not present

## 2019-09-12 DIAGNOSIS — M170 Bilateral primary osteoarthritis of knee: Secondary | ICD-10-CM | POA: Diagnosis not present

## 2019-09-12 DIAGNOSIS — E782 Mixed hyperlipidemia: Secondary | ICD-10-CM | POA: Diagnosis not present

## 2019-09-12 DIAGNOSIS — N400 Enlarged prostate without lower urinary tract symptoms: Secondary | ICD-10-CM | POA: Diagnosis not present

## 2019-09-12 DIAGNOSIS — I10 Essential (primary) hypertension: Secondary | ICD-10-CM | POA: Diagnosis not present

## 2019-09-16 DIAGNOSIS — E782 Mixed hyperlipidemia: Secondary | ICD-10-CM | POA: Diagnosis not present

## 2019-09-16 DIAGNOSIS — Z0000 Encounter for general adult medical examination without abnormal findings: Secondary | ICD-10-CM | POA: Diagnosis not present

## 2019-09-16 DIAGNOSIS — Z1389 Encounter for screening for other disorder: Secondary | ICD-10-CM | POA: Diagnosis not present

## 2019-09-16 DIAGNOSIS — N1831 Chronic kidney disease, stage 3a: Secondary | ICD-10-CM | POA: Diagnosis not present

## 2019-09-16 DIAGNOSIS — I10 Essential (primary) hypertension: Secondary | ICD-10-CM | POA: Diagnosis not present

## 2019-09-16 DIAGNOSIS — K219 Gastro-esophageal reflux disease without esophagitis: Secondary | ICD-10-CM | POA: Diagnosis not present

## 2019-09-16 DIAGNOSIS — G244 Idiopathic orofacial dystonia: Secondary | ICD-10-CM | POA: Diagnosis not present

## 2019-09-16 DIAGNOSIS — N400 Enlarged prostate without lower urinary tract symptoms: Secondary | ICD-10-CM | POA: Diagnosis not present

## 2019-09-16 DIAGNOSIS — M109 Gout, unspecified: Secondary | ICD-10-CM | POA: Diagnosis not present

## 2019-10-03 DIAGNOSIS — H16223 Keratoconjunctivitis sicca, not specified as Sjogren's, bilateral: Secondary | ICD-10-CM | POA: Diagnosis not present

## 2019-10-03 DIAGNOSIS — H2513 Age-related nuclear cataract, bilateral: Secondary | ICD-10-CM | POA: Diagnosis not present

## 2019-10-03 DIAGNOSIS — G245 Blepharospasm: Secondary | ICD-10-CM | POA: Diagnosis not present

## 2019-10-11 DIAGNOSIS — Z85828 Personal history of other malignant neoplasm of skin: Secondary | ICD-10-CM | POA: Diagnosis not present

## 2019-10-11 DIAGNOSIS — D485 Neoplasm of uncertain behavior of skin: Secondary | ICD-10-CM | POA: Diagnosis not present

## 2019-10-11 DIAGNOSIS — L820 Inflamed seborrheic keratosis: Secondary | ICD-10-CM | POA: Diagnosis not present

## 2019-10-14 DIAGNOSIS — H16222 Keratoconjunctivitis sicca, not specified as Sjogren's, left eye: Secondary | ICD-10-CM | POA: Diagnosis not present

## 2019-10-25 DIAGNOSIS — M9901 Segmental and somatic dysfunction of cervical region: Secondary | ICD-10-CM | POA: Diagnosis not present

## 2019-10-25 DIAGNOSIS — Z86010 Personal history of colonic polyps: Secondary | ICD-10-CM | POA: Diagnosis not present

## 2019-10-25 DIAGNOSIS — M9903 Segmental and somatic dysfunction of lumbar region: Secondary | ICD-10-CM | POA: Diagnosis not present

## 2019-10-25 DIAGNOSIS — K317 Polyp of stomach and duodenum: Secondary | ICD-10-CM | POA: Diagnosis not present

## 2019-10-25 DIAGNOSIS — M9902 Segmental and somatic dysfunction of thoracic region: Secondary | ICD-10-CM | POA: Diagnosis not present

## 2019-10-25 DIAGNOSIS — K219 Gastro-esophageal reflux disease without esophagitis: Secondary | ICD-10-CM | POA: Diagnosis not present

## 2019-10-25 DIAGNOSIS — M542 Cervicalgia: Secondary | ICD-10-CM | POA: Diagnosis not present

## 2019-10-27 DIAGNOSIS — M9901 Segmental and somatic dysfunction of cervical region: Secondary | ICD-10-CM | POA: Diagnosis not present

## 2019-10-27 DIAGNOSIS — M9902 Segmental and somatic dysfunction of thoracic region: Secondary | ICD-10-CM | POA: Diagnosis not present

## 2019-10-27 DIAGNOSIS — M542 Cervicalgia: Secondary | ICD-10-CM | POA: Diagnosis not present

## 2019-10-27 DIAGNOSIS — M9903 Segmental and somatic dysfunction of lumbar region: Secondary | ICD-10-CM | POA: Diagnosis not present

## 2019-10-31 DIAGNOSIS — M170 Bilateral primary osteoarthritis of knee: Secondary | ICD-10-CM | POA: Diagnosis not present

## 2019-10-31 DIAGNOSIS — I10 Essential (primary) hypertension: Secondary | ICD-10-CM | POA: Diagnosis not present

## 2019-10-31 DIAGNOSIS — M9903 Segmental and somatic dysfunction of lumbar region: Secondary | ICD-10-CM | POA: Diagnosis not present

## 2019-10-31 DIAGNOSIS — N400 Enlarged prostate without lower urinary tract symptoms: Secondary | ICD-10-CM | POA: Diagnosis not present

## 2019-10-31 DIAGNOSIS — M9901 Segmental and somatic dysfunction of cervical region: Secondary | ICD-10-CM | POA: Diagnosis not present

## 2019-10-31 DIAGNOSIS — M9902 Segmental and somatic dysfunction of thoracic region: Secondary | ICD-10-CM | POA: Diagnosis not present

## 2019-10-31 DIAGNOSIS — E782 Mixed hyperlipidemia: Secondary | ICD-10-CM | POA: Diagnosis not present

## 2019-10-31 DIAGNOSIS — M542 Cervicalgia: Secondary | ICD-10-CM | POA: Diagnosis not present

## 2019-11-01 DIAGNOSIS — G4733 Obstructive sleep apnea (adult) (pediatric): Secondary | ICD-10-CM | POA: Diagnosis not present

## 2019-11-02 DIAGNOSIS — G245 Blepharospasm: Secondary | ICD-10-CM | POA: Diagnosis not present

## 2019-11-03 DIAGNOSIS — M9901 Segmental and somatic dysfunction of cervical region: Secondary | ICD-10-CM | POA: Diagnosis not present

## 2019-11-03 DIAGNOSIS — M9903 Segmental and somatic dysfunction of lumbar region: Secondary | ICD-10-CM | POA: Diagnosis not present

## 2019-11-03 DIAGNOSIS — M9902 Segmental and somatic dysfunction of thoracic region: Secondary | ICD-10-CM | POA: Diagnosis not present

## 2019-11-03 DIAGNOSIS — M542 Cervicalgia: Secondary | ICD-10-CM | POA: Diagnosis not present

## 2019-11-07 DIAGNOSIS — M9903 Segmental and somatic dysfunction of lumbar region: Secondary | ICD-10-CM | POA: Diagnosis not present

## 2019-11-07 DIAGNOSIS — M9901 Segmental and somatic dysfunction of cervical region: Secondary | ICD-10-CM | POA: Diagnosis not present

## 2019-11-07 DIAGNOSIS — M542 Cervicalgia: Secondary | ICD-10-CM | POA: Diagnosis not present

## 2019-11-07 DIAGNOSIS — M9902 Segmental and somatic dysfunction of thoracic region: Secondary | ICD-10-CM | POA: Diagnosis not present

## 2019-11-10 DIAGNOSIS — M542 Cervicalgia: Secondary | ICD-10-CM | POA: Diagnosis not present

## 2019-11-10 DIAGNOSIS — M9902 Segmental and somatic dysfunction of thoracic region: Secondary | ICD-10-CM | POA: Diagnosis not present

## 2019-11-10 DIAGNOSIS — M9901 Segmental and somatic dysfunction of cervical region: Secondary | ICD-10-CM | POA: Diagnosis not present

## 2019-11-10 DIAGNOSIS — M9903 Segmental and somatic dysfunction of lumbar region: Secondary | ICD-10-CM | POA: Diagnosis not present

## 2019-11-14 DIAGNOSIS — M9902 Segmental and somatic dysfunction of thoracic region: Secondary | ICD-10-CM | POA: Diagnosis not present

## 2019-11-14 DIAGNOSIS — M9903 Segmental and somatic dysfunction of lumbar region: Secondary | ICD-10-CM | POA: Diagnosis not present

## 2019-11-14 DIAGNOSIS — M9901 Segmental and somatic dysfunction of cervical region: Secondary | ICD-10-CM | POA: Diagnosis not present

## 2019-11-14 DIAGNOSIS — M542 Cervicalgia: Secondary | ICD-10-CM | POA: Diagnosis not present

## 2019-11-17 DIAGNOSIS — M9901 Segmental and somatic dysfunction of cervical region: Secondary | ICD-10-CM | POA: Diagnosis not present

## 2019-11-17 DIAGNOSIS — M9903 Segmental and somatic dysfunction of lumbar region: Secondary | ICD-10-CM | POA: Diagnosis not present

## 2019-11-17 DIAGNOSIS — M9902 Segmental and somatic dysfunction of thoracic region: Secondary | ICD-10-CM | POA: Diagnosis not present

## 2019-11-17 DIAGNOSIS — M542 Cervicalgia: Secondary | ICD-10-CM | POA: Diagnosis not present

## 2019-11-21 DIAGNOSIS — N400 Enlarged prostate without lower urinary tract symptoms: Secondary | ICD-10-CM | POA: Diagnosis not present

## 2019-11-21 DIAGNOSIS — E782 Mixed hyperlipidemia: Secondary | ICD-10-CM | POA: Diagnosis not present

## 2019-11-21 DIAGNOSIS — M170 Bilateral primary osteoarthritis of knee: Secondary | ICD-10-CM | POA: Diagnosis not present

## 2019-11-21 DIAGNOSIS — I10 Essential (primary) hypertension: Secondary | ICD-10-CM | POA: Diagnosis not present

## 2019-11-24 DIAGNOSIS — Z1159 Encounter for screening for other viral diseases: Secondary | ICD-10-CM | POA: Diagnosis not present

## 2019-11-29 DIAGNOSIS — K219 Gastro-esophageal reflux disease without esophagitis: Secondary | ICD-10-CM | POA: Diagnosis not present

## 2019-11-29 DIAGNOSIS — K317 Polyp of stomach and duodenum: Secondary | ICD-10-CM | POA: Diagnosis not present

## 2019-12-02 DIAGNOSIS — K317 Polyp of stomach and duodenum: Secondary | ICD-10-CM | POA: Diagnosis not present

## 2019-12-06 DIAGNOSIS — G244 Idiopathic orofacial dystonia: Secondary | ICD-10-CM | POA: Diagnosis not present

## 2019-12-06 DIAGNOSIS — G243 Spasmodic torticollis: Secondary | ICD-10-CM | POA: Diagnosis not present

## 2019-12-06 DIAGNOSIS — G245 Blepharospasm: Secondary | ICD-10-CM | POA: Diagnosis not present

## 2019-12-08 DIAGNOSIS — Z85828 Personal history of other malignant neoplasm of skin: Secondary | ICD-10-CM | POA: Diagnosis not present

## 2019-12-08 DIAGNOSIS — L718 Other rosacea: Secondary | ICD-10-CM | POA: Diagnosis not present

## 2019-12-08 DIAGNOSIS — L918 Other hypertrophic disorders of the skin: Secondary | ICD-10-CM | POA: Diagnosis not present

## 2020-01-23 DIAGNOSIS — G245 Blepharospasm: Secondary | ICD-10-CM | POA: Diagnosis not present

## 2020-01-24 DIAGNOSIS — N1831 Chronic kidney disease, stage 3a: Secondary | ICD-10-CM | POA: Diagnosis not present

## 2020-01-24 DIAGNOSIS — G244 Idiopathic orofacial dystonia: Secondary | ICD-10-CM | POA: Diagnosis not present

## 2020-01-24 DIAGNOSIS — K219 Gastro-esophageal reflux disease without esophagitis: Secondary | ICD-10-CM | POA: Diagnosis not present

## 2020-01-24 DIAGNOSIS — M109 Gout, unspecified: Secondary | ICD-10-CM | POA: Diagnosis not present

## 2020-01-24 DIAGNOSIS — E782 Mixed hyperlipidemia: Secondary | ICD-10-CM | POA: Diagnosis not present

## 2020-01-24 DIAGNOSIS — G4733 Obstructive sleep apnea (adult) (pediatric): Secondary | ICD-10-CM | POA: Diagnosis not present

## 2020-01-24 DIAGNOSIS — Z0000 Encounter for general adult medical examination without abnormal findings: Secondary | ICD-10-CM | POA: Diagnosis not present

## 2020-01-24 DIAGNOSIS — N400 Enlarged prostate without lower urinary tract symptoms: Secondary | ICD-10-CM | POA: Diagnosis not present

## 2020-01-24 DIAGNOSIS — M170 Bilateral primary osteoarthritis of knee: Secondary | ICD-10-CM | POA: Diagnosis not present

## 2020-01-24 DIAGNOSIS — I10 Essential (primary) hypertension: Secondary | ICD-10-CM | POA: Diagnosis not present

## 2020-01-24 DIAGNOSIS — G479 Sleep disorder, unspecified: Secondary | ICD-10-CM | POA: Diagnosis not present

## 2020-01-24 DIAGNOSIS — R7309 Other abnormal glucose: Secondary | ICD-10-CM | POA: Diagnosis not present

## 2020-02-25 DIAGNOSIS — M170 Bilateral primary osteoarthritis of knee: Secondary | ICD-10-CM | POA: Diagnosis not present

## 2020-02-25 DIAGNOSIS — I10 Essential (primary) hypertension: Secondary | ICD-10-CM | POA: Diagnosis not present

## 2020-02-25 DIAGNOSIS — N400 Enlarged prostate without lower urinary tract symptoms: Secondary | ICD-10-CM | POA: Diagnosis not present

## 2020-02-25 DIAGNOSIS — E782 Mixed hyperlipidemia: Secondary | ICD-10-CM | POA: Diagnosis not present

## 2020-03-19 DIAGNOSIS — D485 Neoplasm of uncertain behavior of skin: Secondary | ICD-10-CM | POA: Diagnosis not present

## 2020-03-19 DIAGNOSIS — L739 Follicular disorder, unspecified: Secondary | ICD-10-CM | POA: Diagnosis not present

## 2020-03-19 DIAGNOSIS — L853 Xerosis cutis: Secondary | ICD-10-CM | POA: Diagnosis not present

## 2020-03-19 DIAGNOSIS — Z85828 Personal history of other malignant neoplasm of skin: Secondary | ICD-10-CM | POA: Diagnosis not present

## 2020-03-19 DIAGNOSIS — L821 Other seborrheic keratosis: Secondary | ICD-10-CM | POA: Diagnosis not present

## 2020-03-20 DIAGNOSIS — J309 Allergic rhinitis, unspecified: Secondary | ICD-10-CM | POA: Diagnosis not present

## 2020-03-20 DIAGNOSIS — E782 Mixed hyperlipidemia: Secondary | ICD-10-CM | POA: Diagnosis not present

## 2020-03-20 DIAGNOSIS — I10 Essential (primary) hypertension: Secondary | ICD-10-CM | POA: Diagnosis not present

## 2020-03-20 DIAGNOSIS — K219 Gastro-esophageal reflux disease without esophagitis: Secondary | ICD-10-CM | POA: Diagnosis not present

## 2020-03-20 DIAGNOSIS — M109 Gout, unspecified: Secondary | ICD-10-CM | POA: Diagnosis not present

## 2020-03-20 DIAGNOSIS — M170 Bilateral primary osteoarthritis of knee: Secondary | ICD-10-CM | POA: Diagnosis not present

## 2020-03-20 DIAGNOSIS — G479 Sleep disorder, unspecified: Secondary | ICD-10-CM | POA: Diagnosis not present

## 2020-03-20 DIAGNOSIS — G4733 Obstructive sleep apnea (adult) (pediatric): Secondary | ICD-10-CM | POA: Diagnosis not present

## 2020-03-20 DIAGNOSIS — R7309 Other abnormal glucose: Secondary | ICD-10-CM | POA: Diagnosis not present

## 2020-03-20 DIAGNOSIS — G244 Idiopathic orofacial dystonia: Secondary | ICD-10-CM | POA: Diagnosis not present

## 2020-03-20 DIAGNOSIS — N400 Enlarged prostate without lower urinary tract symptoms: Secondary | ICD-10-CM | POA: Diagnosis not present

## 2020-03-20 DIAGNOSIS — N1831 Chronic kidney disease, stage 3a: Secondary | ICD-10-CM | POA: Diagnosis not present

## 2020-03-23 DIAGNOSIS — G4733 Obstructive sleep apnea (adult) (pediatric): Secondary | ICD-10-CM | POA: Diagnosis not present

## 2020-03-23 DIAGNOSIS — K219 Gastro-esophageal reflux disease without esophagitis: Secondary | ICD-10-CM | POA: Diagnosis not present

## 2020-03-23 DIAGNOSIS — N1831 Chronic kidney disease, stage 3a: Secondary | ICD-10-CM | POA: Diagnosis not present

## 2020-03-23 DIAGNOSIS — M109 Gout, unspecified: Secondary | ICD-10-CM | POA: Diagnosis not present

## 2020-03-23 DIAGNOSIS — E782 Mixed hyperlipidemia: Secondary | ICD-10-CM | POA: Diagnosis not present

## 2020-03-23 DIAGNOSIS — I10 Essential (primary) hypertension: Secondary | ICD-10-CM | POA: Diagnosis not present

## 2020-03-23 DIAGNOSIS — R7309 Other abnormal glucose: Secondary | ICD-10-CM | POA: Diagnosis not present

## 2020-03-23 DIAGNOSIS — G479 Sleep disorder, unspecified: Secondary | ICD-10-CM | POA: Diagnosis not present

## 2020-03-23 DIAGNOSIS — N400 Enlarged prostate without lower urinary tract symptoms: Secondary | ICD-10-CM | POA: Diagnosis not present

## 2020-03-23 DIAGNOSIS — G244 Idiopathic orofacial dystonia: Secondary | ICD-10-CM | POA: Diagnosis not present

## 2020-03-23 DIAGNOSIS — M170 Bilateral primary osteoarthritis of knee: Secondary | ICD-10-CM | POA: Diagnosis not present

## 2020-03-23 DIAGNOSIS — Z23 Encounter for immunization: Secondary | ICD-10-CM | POA: Diagnosis not present

## 2020-04-04 DIAGNOSIS — N1831 Chronic kidney disease, stage 3a: Secondary | ICD-10-CM | POA: Diagnosis not present

## 2020-04-04 DIAGNOSIS — E782 Mixed hyperlipidemia: Secondary | ICD-10-CM | POA: Diagnosis not present

## 2020-04-04 DIAGNOSIS — M170 Bilateral primary osteoarthritis of knee: Secondary | ICD-10-CM | POA: Diagnosis not present

## 2020-04-04 DIAGNOSIS — I10 Essential (primary) hypertension: Secondary | ICD-10-CM | POA: Diagnosis not present

## 2020-04-04 DIAGNOSIS — N400 Enlarged prostate without lower urinary tract symptoms: Secondary | ICD-10-CM | POA: Diagnosis not present

## 2020-04-09 DIAGNOSIS — G245 Blepharospasm: Secondary | ICD-10-CM | POA: Diagnosis not present

## 2020-04-17 DIAGNOSIS — Z23 Encounter for immunization: Secondary | ICD-10-CM | POA: Diagnosis not present

## 2020-05-02 DIAGNOSIS — I10 Essential (primary) hypertension: Secondary | ICD-10-CM | POA: Diagnosis not present

## 2020-05-02 DIAGNOSIS — N1831 Chronic kidney disease, stage 3a: Secondary | ICD-10-CM | POA: Diagnosis not present

## 2020-05-02 DIAGNOSIS — M170 Bilateral primary osteoarthritis of knee: Secondary | ICD-10-CM | POA: Diagnosis not present

## 2020-05-02 DIAGNOSIS — E782 Mixed hyperlipidemia: Secondary | ICD-10-CM | POA: Diagnosis not present

## 2020-05-02 DIAGNOSIS — N400 Enlarged prostate without lower urinary tract symptoms: Secondary | ICD-10-CM | POA: Diagnosis not present

## 2020-05-08 DIAGNOSIS — G244 Idiopathic orofacial dystonia: Secondary | ICD-10-CM | POA: Diagnosis not present

## 2020-05-08 DIAGNOSIS — G243 Spasmodic torticollis: Secondary | ICD-10-CM | POA: Diagnosis not present

## 2020-05-08 DIAGNOSIS — G245 Blepharospasm: Secondary | ICD-10-CM | POA: Diagnosis not present

## 2020-05-24 DIAGNOSIS — M170 Bilateral primary osteoarthritis of knee: Secondary | ICD-10-CM | POA: Diagnosis not present

## 2020-05-24 DIAGNOSIS — I10 Essential (primary) hypertension: Secondary | ICD-10-CM | POA: Diagnosis not present

## 2020-05-24 DIAGNOSIS — K219 Gastro-esophageal reflux disease without esophagitis: Secondary | ICD-10-CM | POA: Diagnosis not present

## 2020-05-24 DIAGNOSIS — N400 Enlarged prostate without lower urinary tract symptoms: Secondary | ICD-10-CM | POA: Diagnosis not present

## 2020-05-24 DIAGNOSIS — N1831 Chronic kidney disease, stage 3a: Secondary | ICD-10-CM | POA: Diagnosis not present

## 2020-05-24 DIAGNOSIS — E782 Mixed hyperlipidemia: Secondary | ICD-10-CM | POA: Diagnosis not present

## 2020-06-25 DIAGNOSIS — G245 Blepharospasm: Secondary | ICD-10-CM | POA: Diagnosis not present

## 2020-07-02 DIAGNOSIS — N1831 Chronic kidney disease, stage 3a: Secondary | ICD-10-CM | POA: Diagnosis not present

## 2020-07-02 DIAGNOSIS — I10 Essential (primary) hypertension: Secondary | ICD-10-CM | POA: Diagnosis not present

## 2020-07-02 DIAGNOSIS — K219 Gastro-esophageal reflux disease without esophagitis: Secondary | ICD-10-CM | POA: Diagnosis not present

## 2020-07-02 DIAGNOSIS — E782 Mixed hyperlipidemia: Secondary | ICD-10-CM | POA: Diagnosis not present

## 2020-07-02 DIAGNOSIS — M170 Bilateral primary osteoarthritis of knee: Secondary | ICD-10-CM | POA: Diagnosis not present

## 2020-07-02 DIAGNOSIS — N400 Enlarged prostate without lower urinary tract symptoms: Secondary | ICD-10-CM | POA: Diagnosis not present

## 2020-08-01 DIAGNOSIS — I10 Essential (primary) hypertension: Secondary | ICD-10-CM | POA: Diagnosis not present

## 2020-08-01 DIAGNOSIS — E782 Mixed hyperlipidemia: Secondary | ICD-10-CM | POA: Diagnosis not present

## 2020-08-01 DIAGNOSIS — N1831 Chronic kidney disease, stage 3a: Secondary | ICD-10-CM | POA: Diagnosis not present

## 2020-08-01 DIAGNOSIS — K219 Gastro-esophageal reflux disease without esophagitis: Secondary | ICD-10-CM | POA: Diagnosis not present

## 2020-08-01 DIAGNOSIS — M170 Bilateral primary osteoarthritis of knee: Secondary | ICD-10-CM | POA: Diagnosis not present

## 2020-08-01 DIAGNOSIS — N400 Enlarged prostate without lower urinary tract symptoms: Secondary | ICD-10-CM | POA: Diagnosis not present

## 2020-08-03 ENCOUNTER — Ambulatory Visit (HOSPITAL_BASED_OUTPATIENT_CLINIC_OR_DEPARTMENT_OTHER): Payer: Medicare Other | Admitting: Registered"

## 2020-08-03 DIAGNOSIS — R7303 Prediabetes: Secondary | ICD-10-CM | POA: Diagnosis not present

## 2020-08-06 ENCOUNTER — Encounter: Payer: Self-pay | Admitting: Registered"

## 2020-08-06 NOTE — Progress Notes (Signed)
On 08/03/20 patient completed Core Session 1 of Diabetes Prevention Program course virtually with [facility]. The following learning objectives were met by the patient during this class:   Virtual Visit via Video Note  I connected with John Novak by a video enabled application and verified that I am speaking with the correct person.  Location: Patient: Home  Provider: Office    Learning Objectives:   Be able to explain the purpose and benefits of the National Diabetes Prevention Program.   Be able to describe the events that will take place at every session.   Know the weight loss and physical activity goals established by the [facility] Diabetes Prevention Program.   Know their own individual weight loss and physical activity goals.   Be able to explain the important effect of self-monitoring on behavior change.   Goals:  . Record food and beverage intake in "Food and Activity Tracker" over the next week.  . E-mail completed "Food and Activity Tracker" to Lifestyle Coach next week before session 2. . Circle the foods or beverages you think are highest in fat and calories in your food tracker. . Read the labels on the food you buy, and consider using measuring cups and spoons to help you calculate the amount you eat. We will talk about measuring in more detail in the coming weeks.   Follow-Up Plan:  Attend Core Session 2 next week.   E-mail completed "Food and Activity Tracker" to Lifestyle Coach next week before class.

## 2020-08-10 ENCOUNTER — Encounter: Payer: Medicare Other | Attending: Family Medicine | Admitting: Registered"

## 2020-08-10 ENCOUNTER — Encounter: Payer: Self-pay | Admitting: Registered"

## 2020-08-10 DIAGNOSIS — R7303 Prediabetes: Secondary | ICD-10-CM | POA: Insufficient documentation

## 2020-08-10 NOTE — Progress Notes (Signed)
On 08/10/20 patient completed Core Session 2 of Diabetes Prevention Program course virtually with [facility]. The following learning objectives were met by the patient during this class:   I connected with Burnis Medin by a video enabled application and verified that I am speaking with the correct person.  Location: Patient: Home.  Provider: Office.   Learning Objectives:  Self-monitor their weight during the weeks following Session 2.   Describe the relationship between fat and calories.   Explain the reason for, and basic principles of, self-monitoring fat grams and calories.   Identify their personal fat gram goals.   Use the ?Fat and Calorie Counter to calculate the calories and fat grams of a given selection of foods.   Keep a running total of the fat grams they eat each day.   Calculate fat, calories, and serving sizes from nutrition labels.   Goals:   Weigh yourself at the same time each day, or every few days, and record your weight in your Food and Activity Tracker.  Write down everything you eat and drink in your Food and Activity Tracker.  Measure portions as much as you can, and start reading labels.   Use the ?Fat and Calorie Counter to figure out the amount of fat and calories in what you ate, and write the amount down in your Food and Activity Tracker.  Keep a running fat gram total throughout the day. Come as close to your fat gram goal as you can.   Follow-Up Plan:  Attend Core Session 3 next week.   Email completed  "Food and Activity Tracker" to Lifestyle Coach next week.

## 2020-08-17 ENCOUNTER — Encounter: Payer: Self-pay | Admitting: Registered"

## 2020-08-17 ENCOUNTER — Encounter (HOSPITAL_BASED_OUTPATIENT_CLINIC_OR_DEPARTMENT_OTHER): Payer: Medicare Other | Admitting: Registered"

## 2020-08-17 DIAGNOSIS — R7303 Prediabetes: Secondary | ICD-10-CM

## 2020-08-17 NOTE — Progress Notes (Signed)
On 08/17/20 patient completed Core Session 3 of Diabetes Prevention Program course virtually with [facility]. The following learning objectives were met by the patient during this class:   I connected with John Novak by a video enabled application and verified that I am speaking with the correct person.  Location: Patient: Home.  Provider: Office.   Learning Objectives:  Weigh and measure foods.  Estimate the fat and calorie content of common foods.  Describe three ways to eat less fat and fewer calories.  Create a plan to eat less fat for the following week.   Goals:   Track weight when weighing outside of class.   Track food and beverages eaten each day in Food and Activity Tracker and include fat grams and calories for each.   Try to stay within fat gram goal.   Complete plan for eating less high fat foods and answer related homework questions.    Follow-Up Plan:  Attend Core Session 4 next week.   Bring completed "Food and Activity Tracker" next week to be reviewed by Lifestyle Coach.

## 2020-08-24 ENCOUNTER — Encounter (HOSPITAL_BASED_OUTPATIENT_CLINIC_OR_DEPARTMENT_OTHER): Payer: Medicare Other | Admitting: Registered"

## 2020-08-24 DIAGNOSIS — R7303 Prediabetes: Secondary | ICD-10-CM | POA: Diagnosis not present

## 2020-08-28 DIAGNOSIS — N400 Enlarged prostate without lower urinary tract symptoms: Secondary | ICD-10-CM | POA: Diagnosis not present

## 2020-08-28 DIAGNOSIS — M170 Bilateral primary osteoarthritis of knee: Secondary | ICD-10-CM | POA: Diagnosis not present

## 2020-08-28 DIAGNOSIS — N1831 Chronic kidney disease, stage 3a: Secondary | ICD-10-CM | POA: Diagnosis not present

## 2020-08-28 DIAGNOSIS — K219 Gastro-esophageal reflux disease without esophagitis: Secondary | ICD-10-CM | POA: Diagnosis not present

## 2020-08-28 DIAGNOSIS — I10 Essential (primary) hypertension: Secondary | ICD-10-CM | POA: Diagnosis not present

## 2020-08-28 DIAGNOSIS — E782 Mixed hyperlipidemia: Secondary | ICD-10-CM | POA: Diagnosis not present

## 2020-08-30 ENCOUNTER — Encounter: Payer: Self-pay | Admitting: Registered"

## 2020-08-30 NOTE — Progress Notes (Addendum)
On 08/24/20 patient completed Core Session 4 of Diabetes Prevention Program course virtually with [facility]. The following learning objectives were met by the patient during this class:    Virtual Visit via Video Note  I connected with John Novak on 08/24/20 at 11:30 AM EST by a video enabled application and verified that I am speaking with the correct person using two identifiers.  Location: Patient: Home.  Provider: Office.    Learning Objectives:  Describe the MyPlate food guide and its recommendations, including how to reduce fat and calories in our diet.  Compare and contrast MyPlate guidelines with participants' eating habits.  List ways to replace high-fat and high-calorie foods with low-fat and low-calorie foods.  Explain the importance of eating plenty of whole grains, vegetables, and fruits, while staying within fat gram goals.  Explain the importance of eating foods from all groups of MyPlate and of eating a variety of foods from within each group.  Explain why a balanced diet is beneficial to health.  Goals:   Record weight taken outside of class.   Track foods and beverages eaten each day in the "Food and Activity Tracker," including calories and fat grams for each item.   Practice comparing what you eat with the recommendations of MyPlate using the "Rate Your Plate" handout.   Complete the "Rate Your Plate" handout form on at least 3 days.   Answer homework questions.   Follow-Up Plan:  Attend Core Session 5 next week.   Email completed "Food and Activity Tracker" next week to be reviewed by Lifestyle Coach.

## 2020-08-31 ENCOUNTER — Encounter: Payer: Medicare Other | Attending: Family Medicine | Admitting: Registered"

## 2020-08-31 DIAGNOSIS — R7303 Prediabetes: Secondary | ICD-10-CM | POA: Diagnosis not present

## 2020-09-03 ENCOUNTER — Encounter: Payer: Self-pay | Admitting: Registered"

## 2020-09-03 NOTE — Progress Notes (Addendum)
On 08/31/20 patient completed Core Session 5 of Diabetes Prevention Program course virtually with [facility]. The following learning objectives were met by the patient during this class:   Virtual Visit via Video Note  I connected with John Novak on 08/31/20 at 11:30 AM EST by a video enabled application and verified that I am speaking with the correct person using two identifiers.   Location: Patient: Home.  Provider: Office.   Learning Objectives:  Establish a physical activity goal.  Explain the importance of the physical activity goal.  Describe their current level of physical activity.  Name ways that they are already physically active.  Develop personal plans for physical activity for the next week.   Goals:   Record weight taken outside of class.   Track foods and beverages eaten each day in the "Food and Activity Tracker," including calories and fat grams for each item.   Make an Activity Plan including date, specific type of activity, and length of time you plan to be active that includes at last 60 minutes of activity for the week.   Track activity type, minutes you were active, and distance you reached each day in the "Food and Activity Tracker."   Follow-Up Plan: . Attend Core Session 6 next week.  . E-mail completed "Food and Activity Tracker" to Lifestyle Coach next week before class

## 2020-09-07 ENCOUNTER — Encounter: Payer: Medicare Other | Attending: Family Medicine | Admitting: Registered"

## 2020-09-07 DIAGNOSIS — R7303 Prediabetes: Secondary | ICD-10-CM | POA: Diagnosis not present

## 2020-09-09 ENCOUNTER — Encounter: Payer: Self-pay | Admitting: Registered"

## 2020-09-09 NOTE — Progress Notes (Addendum)
On 09/07/20 patient completed Core Session 6 of Diabetes Prevention Program course virtually with [facility]. The following learning objectives were met by the patient during this class:   Virtual Visit via Video Note  I connected with John Novak on 09/07/20 at 11:30 AM EST by a video enabled application and verified that I am speaking with the correct person using two identifiers.  Location: Patient: Home.  Provider: Office.   Learning Objectives:  Graph their daily physical activity.   Describe two ways of finding the time to be active.   Define "lifestyle activity."   Describe how to prevent injury.   Develop an activity plan for the coming week.   Goals:   Record weight taken outside of class.   Track foods and beverages eaten each day in the "Food and Activity Tracker," including calories and fat grams for each item.    Track activity type, minutes you were active, and distance you reached each day in the "Food and Activity Tracker."   Set aside one 20 to 30-minute block of time every day or find two or more periods of 10 to15 minutes each for physical activity.   Warm up, cool down, and stretch.  Make a Physical Activities Plan for the Week.   Follow-Up Plan:  Attend Core Session 7 next week.   E-mail completed "Food and Activity Tracker" to Lifestyle Coach next week before class

## 2020-09-10 DIAGNOSIS — G245 Blepharospasm: Secondary | ICD-10-CM | POA: Diagnosis not present

## 2020-09-14 ENCOUNTER — Encounter (HOSPITAL_BASED_OUTPATIENT_CLINIC_OR_DEPARTMENT_OTHER): Payer: Medicare Other | Admitting: Registered"

## 2020-09-14 DIAGNOSIS — R7303 Prediabetes: Secondary | ICD-10-CM | POA: Diagnosis not present

## 2020-09-15 ENCOUNTER — Encounter: Payer: Self-pay | Admitting: Registered"

## 2020-09-15 NOTE — Progress Notes (Signed)
On 09/14/20 patient completed Core Session 7 of Diabetes Prevention Program course virtually with [facility]. The following learning objectives were met by the patient during this class:   Virtual Visit via Video Note  I connected with John Novak by a video enabled application and verified that I am speaking with the correct person using two identifiers.  Location: Patient: Home. Provider: Office.   Learning Objectives:  Define calorie balance.  Explain how healthy eating and being active are related in terms of calorie balance.   Describe the relationship between calorie balance and weight loss.   Describe his or her progress as it relates to calorie balance.   Develop an activity plan for the coming week.   Goals:   Record weight taken outside of class.   Track foods and beverages eaten each day in the "Food and Activity Tracker," including calories and fat grams for each item.    Track activity type, minutes you were active, and distance you reached each day in the "Food and Activity Tracker."   Set aside one 20 to 30-minute block of time every day or find two or more periods of 10 to15 minutes each for physical activity.   Make a Physical Activities Plan for the Week.   Make active lifestyle choices all through the day   Stay at or go slightly over activity goal.   Follow-Up Plan:  Attend Core Session 8 next week.   E-mail completed "Food and Activity Tracker" to Lifestyle Coach next week before class

## 2020-09-18 DIAGNOSIS — M542 Cervicalgia: Secondary | ICD-10-CM | POA: Diagnosis not present

## 2020-09-18 DIAGNOSIS — M9902 Segmental and somatic dysfunction of thoracic region: Secondary | ICD-10-CM | POA: Diagnosis not present

## 2020-09-18 DIAGNOSIS — M9903 Segmental and somatic dysfunction of lumbar region: Secondary | ICD-10-CM | POA: Diagnosis not present

## 2020-09-18 DIAGNOSIS — M9901 Segmental and somatic dysfunction of cervical region: Secondary | ICD-10-CM | POA: Diagnosis not present

## 2020-09-19 DIAGNOSIS — N400 Enlarged prostate without lower urinary tract symptoms: Secondary | ICD-10-CM | POA: Diagnosis not present

## 2020-09-19 DIAGNOSIS — I10 Essential (primary) hypertension: Secondary | ICD-10-CM | POA: Diagnosis not present

## 2020-09-19 DIAGNOSIS — E782 Mixed hyperlipidemia: Secondary | ICD-10-CM | POA: Diagnosis not present

## 2020-09-19 DIAGNOSIS — K219 Gastro-esophageal reflux disease without esophagitis: Secondary | ICD-10-CM | POA: Diagnosis not present

## 2020-09-19 DIAGNOSIS — N1831 Chronic kidney disease, stage 3a: Secondary | ICD-10-CM | POA: Diagnosis not present

## 2020-09-19 DIAGNOSIS — M170 Bilateral primary osteoarthritis of knee: Secondary | ICD-10-CM | POA: Diagnosis not present

## 2020-09-20 DIAGNOSIS — M9903 Segmental and somatic dysfunction of lumbar region: Secondary | ICD-10-CM | POA: Diagnosis not present

## 2020-09-20 DIAGNOSIS — M9902 Segmental and somatic dysfunction of thoracic region: Secondary | ICD-10-CM | POA: Diagnosis not present

## 2020-09-20 DIAGNOSIS — M542 Cervicalgia: Secondary | ICD-10-CM | POA: Diagnosis not present

## 2020-09-20 DIAGNOSIS — M9901 Segmental and somatic dysfunction of cervical region: Secondary | ICD-10-CM | POA: Diagnosis not present

## 2020-09-21 ENCOUNTER — Encounter: Payer: Self-pay | Admitting: Registered"

## 2020-09-21 ENCOUNTER — Encounter (HOSPITAL_BASED_OUTPATIENT_CLINIC_OR_DEPARTMENT_OTHER): Payer: Medicare Other | Admitting: Registered"

## 2020-09-21 DIAGNOSIS — R7303 Prediabetes: Secondary | ICD-10-CM | POA: Diagnosis not present

## 2020-09-21 NOTE — Progress Notes (Signed)
On 09/21/20 patient completed Core Session 8 of Diabetes Prevention Program course virtually with [facility]. The following learning objectives were met by the patient during this class:   Virtual Visit via Video Note  I connected with John Novak on 09/21/20 at 11:30 AM EDT by a video enabled application and verified that I am speaking with the correct person using two identifiers.  Location: Patient: Home.  Provider: Office.   Learning Objectives:  Recognize positive and negative food and activity cues.   Change negative food and activity cues to positive cues.   Add positive cues for activity and eliminate cues for inactivity.   Develop a plan for removing one problem food cue for the coming week.   Goals:   Record weight taken outside of class.   Track foods and beverages eaten each day in the "Food and Activity Tracker," including calories and fat grams for each item.    Track activity type, minutes you were active, and distance you reached each day in the "Food and Activity Tracker."   Set aside one 20 to 30-minute block of time every day or find two or more periods of 10 to15 minutes each for physical activity.   Remove one problem food cue.   Add one positive cue for being more active.  Follow-Up Plan: . Attend Core Session 9 next week.  . Email completed "Food and Activity Tracker" next week to be reviewed by Lifestyle Coach.

## 2020-09-25 DIAGNOSIS — M542 Cervicalgia: Secondary | ICD-10-CM | POA: Diagnosis not present

## 2020-09-25 DIAGNOSIS — M9903 Segmental and somatic dysfunction of lumbar region: Secondary | ICD-10-CM | POA: Diagnosis not present

## 2020-09-25 DIAGNOSIS — M9901 Segmental and somatic dysfunction of cervical region: Secondary | ICD-10-CM | POA: Diagnosis not present

## 2020-09-25 DIAGNOSIS — M9902 Segmental and somatic dysfunction of thoracic region: Secondary | ICD-10-CM | POA: Diagnosis not present

## 2020-09-27 DIAGNOSIS — M542 Cervicalgia: Secondary | ICD-10-CM | POA: Diagnosis not present

## 2020-09-27 DIAGNOSIS — M9903 Segmental and somatic dysfunction of lumbar region: Secondary | ICD-10-CM | POA: Diagnosis not present

## 2020-09-27 DIAGNOSIS — M9901 Segmental and somatic dysfunction of cervical region: Secondary | ICD-10-CM | POA: Diagnosis not present

## 2020-09-27 DIAGNOSIS — M9902 Segmental and somatic dysfunction of thoracic region: Secondary | ICD-10-CM | POA: Diagnosis not present

## 2020-09-28 ENCOUNTER — Encounter (HOSPITAL_BASED_OUTPATIENT_CLINIC_OR_DEPARTMENT_OTHER): Payer: Medicare Other | Admitting: Registered"

## 2020-09-28 DIAGNOSIS — R7303 Prediabetes: Secondary | ICD-10-CM

## 2020-10-01 DIAGNOSIS — M9901 Segmental and somatic dysfunction of cervical region: Secondary | ICD-10-CM | POA: Diagnosis not present

## 2020-10-01 DIAGNOSIS — M9903 Segmental and somatic dysfunction of lumbar region: Secondary | ICD-10-CM | POA: Diagnosis not present

## 2020-10-01 DIAGNOSIS — M9902 Segmental and somatic dysfunction of thoracic region: Secondary | ICD-10-CM | POA: Diagnosis not present

## 2020-10-01 DIAGNOSIS — M542 Cervicalgia: Secondary | ICD-10-CM | POA: Diagnosis not present

## 2020-10-03 ENCOUNTER — Encounter: Payer: Self-pay | Admitting: Registered"

## 2020-10-03 DIAGNOSIS — M9901 Segmental and somatic dysfunction of cervical region: Secondary | ICD-10-CM | POA: Diagnosis not present

## 2020-10-03 DIAGNOSIS — M9904 Segmental and somatic dysfunction of sacral region: Secondary | ICD-10-CM | POA: Diagnosis not present

## 2020-10-03 DIAGNOSIS — M9902 Segmental and somatic dysfunction of thoracic region: Secondary | ICD-10-CM | POA: Diagnosis not present

## 2020-10-03 DIAGNOSIS — M9903 Segmental and somatic dysfunction of lumbar region: Secondary | ICD-10-CM | POA: Diagnosis not present

## 2020-10-03 NOTE — Progress Notes (Signed)
On 09/28/20 patient completed Core Session 9 of Diabetes Prevention Program course virtually with [facility]. The following learning objectives were met by the patient during this class:   Virtual Visit via Video Note  I connected with John Novak on 09/28/20 by a video enabled application and verified that I am speaking with the correct person using two identifiers.  Location: Patient: Home.  Provider: Office.   Learning Objectives:  List and describe five steps to problem solving.   Apply the five problem solving steps to resolve a problem he or she has with eating less fat and fewer calories or being more active.   Goals:   Record weight taken outside of class.   Track foods and beverages eaten each day in the "Food and Activity Tracker," including calories and fat grams for each item.    Track activity type, minutes you were active, and distance you reached each day in the "Food and Activity Tracker."   Set aside one 20 to 30-minute block of time every day or find two or more periods of 10 to15 minutes each for physical activity.   Use problem solving action plan created during session to problem solve.   Follow-Up Plan:  Attend Core Session 10 next week.   Email completed "Food and Activity Tracker" next week to be reviewed by Lifestyle Coach.  Email menus from favorite restaurants to next session for future discussion.

## 2020-10-05 DIAGNOSIS — G244 Idiopathic orofacial dystonia: Secondary | ICD-10-CM | POA: Diagnosis not present

## 2020-10-05 DIAGNOSIS — E782 Mixed hyperlipidemia: Secondary | ICD-10-CM | POA: Diagnosis not present

## 2020-10-05 DIAGNOSIS — G4733 Obstructive sleep apnea (adult) (pediatric): Secondary | ICD-10-CM | POA: Diagnosis not present

## 2020-10-05 DIAGNOSIS — Z0000 Encounter for general adult medical examination without abnormal findings: Secondary | ICD-10-CM | POA: Diagnosis not present

## 2020-10-05 DIAGNOSIS — R7309 Other abnormal glucose: Secondary | ICD-10-CM | POA: Diagnosis not present

## 2020-10-05 DIAGNOSIS — N400 Enlarged prostate without lower urinary tract symptoms: Secondary | ICD-10-CM | POA: Diagnosis not present

## 2020-10-05 DIAGNOSIS — I10 Essential (primary) hypertension: Secondary | ICD-10-CM | POA: Diagnosis not present

## 2020-10-05 DIAGNOSIS — M109 Gout, unspecified: Secondary | ICD-10-CM | POA: Diagnosis not present

## 2020-10-05 DIAGNOSIS — Z1389 Encounter for screening for other disorder: Secondary | ICD-10-CM | POA: Diagnosis not present

## 2020-10-05 DIAGNOSIS — N1831 Chronic kidney disease, stage 3a: Secondary | ICD-10-CM | POA: Diagnosis not present

## 2020-10-05 DIAGNOSIS — Z1159 Encounter for screening for other viral diseases: Secondary | ICD-10-CM | POA: Diagnosis not present

## 2020-10-08 DIAGNOSIS — M9903 Segmental and somatic dysfunction of lumbar region: Secondary | ICD-10-CM | POA: Diagnosis not present

## 2020-10-08 DIAGNOSIS — M542 Cervicalgia: Secondary | ICD-10-CM | POA: Diagnosis not present

## 2020-10-08 DIAGNOSIS — I10 Essential (primary) hypertension: Secondary | ICD-10-CM | POA: Diagnosis not present

## 2020-10-08 DIAGNOSIS — G479 Sleep disorder, unspecified: Secondary | ICD-10-CM | POA: Diagnosis not present

## 2020-10-08 DIAGNOSIS — G4733 Obstructive sleep apnea (adult) (pediatric): Secondary | ICD-10-CM | POA: Diagnosis not present

## 2020-10-08 DIAGNOSIS — M170 Bilateral primary osteoarthritis of knee: Secondary | ICD-10-CM | POA: Diagnosis not present

## 2020-10-08 DIAGNOSIS — M9902 Segmental and somatic dysfunction of thoracic region: Secondary | ICD-10-CM | POA: Diagnosis not present

## 2020-10-08 DIAGNOSIS — G244 Idiopathic orofacial dystonia: Secondary | ICD-10-CM | POA: Diagnosis not present

## 2020-10-08 DIAGNOSIS — K219 Gastro-esophageal reflux disease without esophagitis: Secondary | ICD-10-CM | POA: Diagnosis not present

## 2020-10-08 DIAGNOSIS — E782 Mixed hyperlipidemia: Secondary | ICD-10-CM | POA: Diagnosis not present

## 2020-10-08 DIAGNOSIS — M109 Gout, unspecified: Secondary | ICD-10-CM | POA: Diagnosis not present

## 2020-10-08 DIAGNOSIS — N400 Enlarged prostate without lower urinary tract symptoms: Secondary | ICD-10-CM | POA: Diagnosis not present

## 2020-10-08 DIAGNOSIS — M9901 Segmental and somatic dysfunction of cervical region: Secondary | ICD-10-CM | POA: Diagnosis not present

## 2020-10-08 DIAGNOSIS — J309 Allergic rhinitis, unspecified: Secondary | ICD-10-CM | POA: Diagnosis not present

## 2020-10-08 DIAGNOSIS — Z79899 Other long term (current) drug therapy: Secondary | ICD-10-CM | POA: Diagnosis not present

## 2020-10-12 ENCOUNTER — Encounter: Payer: Medicare Other | Attending: Family Medicine | Admitting: Registered"

## 2020-10-12 DIAGNOSIS — M9902 Segmental and somatic dysfunction of thoracic region: Secondary | ICD-10-CM | POA: Diagnosis not present

## 2020-10-12 DIAGNOSIS — R7303 Prediabetes: Secondary | ICD-10-CM

## 2020-10-12 DIAGNOSIS — M9901 Segmental and somatic dysfunction of cervical region: Secondary | ICD-10-CM | POA: Diagnosis not present

## 2020-10-12 DIAGNOSIS — M542 Cervicalgia: Secondary | ICD-10-CM | POA: Diagnosis not present

## 2020-10-12 DIAGNOSIS — M9903 Segmental and somatic dysfunction of lumbar region: Secondary | ICD-10-CM | POA: Diagnosis not present

## 2020-10-15 ENCOUNTER — Encounter: Payer: Self-pay | Admitting: Registered"

## 2020-10-15 NOTE — Progress Notes (Signed)
On 10/12/20 patient completed Session 11 of Diabetes Prevention Program course virtually with [facility]. By the end of this session patients are able to complete the following objectives:   Virtual Visit via Video Note  I connected with John Novak by a video enabled application and verified that I am speaking with the correct person using two identifiers.  Location: Patient: Home.  Provider: Office.   Learning Objectives:  Give examples of negative thoughts that could prevent them from meeting their goals of losing weight and being more physically active.   Describe how to stop negative thoughts and talk back to them with positive thoughts.   Practice 1) stopping negative thoughts and 2) talking back to negative thoughts with positive ones.    Goals:   Record weight taken outside of class.   Track foods and beverages eaten each day in the "Food and Activity Tracker," including calories and fat grams for each item.    Track activity type, minutes you were active, and distance you reached each day in the "Food and Activity Tracker."   If you have any negative thoughts-write them in your Food and Activity Trackers, along with how you talked back to them. Practice stopping negative thoughts and talking back to them with positive thoughts.   Follow-Up Plan:  Attend Core Session 12 next week.   Email completed "Food and Activity Tracker" before next week to be reviewed by Lifestyle Coach.

## 2020-10-25 DIAGNOSIS — M9903 Segmental and somatic dysfunction of lumbar region: Secondary | ICD-10-CM | POA: Diagnosis not present

## 2020-10-25 DIAGNOSIS — M9902 Segmental and somatic dysfunction of thoracic region: Secondary | ICD-10-CM | POA: Diagnosis not present

## 2020-10-25 DIAGNOSIS — M542 Cervicalgia: Secondary | ICD-10-CM | POA: Diagnosis not present

## 2020-10-25 DIAGNOSIS — M9901 Segmental and somatic dysfunction of cervical region: Secondary | ICD-10-CM | POA: Diagnosis not present

## 2020-10-26 ENCOUNTER — Encounter: Payer: Self-pay | Admitting: Registered"

## 2020-10-26 ENCOUNTER — Encounter (HOSPITAL_BASED_OUTPATIENT_CLINIC_OR_DEPARTMENT_OTHER): Payer: Medicare Other | Admitting: Registered"

## 2020-10-26 DIAGNOSIS — R7303 Prediabetes: Secondary | ICD-10-CM | POA: Diagnosis not present

## 2020-10-26 DIAGNOSIS — Z713 Dietary counseling and surveillance: Secondary | ICD-10-CM

## 2020-10-26 NOTE — Progress Notes (Signed)
Patient was seen on 10/26/20 for the Core Session 12 of Diabetes Prevention Program course at [facility]. By the end of this session patients are able to complete the following objectives:   Virtual Visit via Video Note  I connected with Burnis Medin on 10/26/20 at 11:30 AM EDT by a video enabled application and verified that I am speaking with the correct person using two identifiers.  Location: Patient: Home.  Provider: Office.   Learning Objectives:  Describe their current progress toward defined goals.  Describe common causes for slipping from healthy eating or being  active.  Explain what to do to get back on their feet after a slip.  Goals:   Record weight taken outside of class.   Track foods and beverages eaten each day in the "Food and Activity Tracker," including calories and fat grams for each item.    Track activity type, minutes active, and distance reached each day in the "Food and Activity Tracker."   Try out the two action plans created during session- "Slips from Healthy Eating: Action Plan" and "Slips from Being Active: Action Plan"  Answer questions on the handout.   Follow-Up Plan:  Attend Core Session 13 next week.   Bring completed "Food and Activity Tracker" next week to be reviewed by Lifestyle Coach.

## 2020-10-31 DIAGNOSIS — M9902 Segmental and somatic dysfunction of thoracic region: Secondary | ICD-10-CM | POA: Diagnosis not present

## 2020-10-31 DIAGNOSIS — M542 Cervicalgia: Secondary | ICD-10-CM | POA: Diagnosis not present

## 2020-10-31 DIAGNOSIS — M9903 Segmental and somatic dysfunction of lumbar region: Secondary | ICD-10-CM | POA: Diagnosis not present

## 2020-10-31 DIAGNOSIS — M9901 Segmental and somatic dysfunction of cervical region: Secondary | ICD-10-CM | POA: Diagnosis not present

## 2020-11-02 ENCOUNTER — Encounter (HOSPITAL_BASED_OUTPATIENT_CLINIC_OR_DEPARTMENT_OTHER): Payer: Medicare Other | Admitting: Registered"

## 2020-11-02 ENCOUNTER — Encounter: Payer: Self-pay | Admitting: Registered"

## 2020-11-02 DIAGNOSIS — R7303 Prediabetes: Secondary | ICD-10-CM

## 2020-11-02 DIAGNOSIS — Z713 Dietary counseling and surveillance: Secondary | ICD-10-CM | POA: Diagnosis not present

## 2020-11-02 NOTE — Progress Notes (Signed)
On 11/02/20 patient completed the Core Session 13 of Diabetes Prevention Program course virtually with [facility]. By the end of this session patients are able to complete the following objectives:   Virtual Visit via Video Note  I connected with John Novak on 11/02/20 at 11:30 AM EDT by a video enabled application and verified that I am speaking with the correct person using two identifiers.  Location: Patient: Home.  Provider: Office.   Learning Objectives:  Describe ways to add interest and variety to their activity plans.  Define ?aerobic fitness.  Explain the four F.I.T.T. principles (frequency, intensity, time, and type of activity) and how they relate to aerobic fitness.   Goals:   Record weight taken outside of class.   Track foods and beverages eaten each day in the "Food and Activity Tracker," including calories and fat grams for each item.    Track activity type, minutes you were active, and distance you reached each day in the "Food and Activity Tracker."   Do your best to reach activity goal for the week.  Use one of the F.I.T.T. principles to jump start workouts.  Document activity level on the "To Do Next Week" handout.  Follow-Up Plan:  Attend Core Session 14 next week.   Email completed "Food and Activity Tracker" before next week to be reviewed by Lifestyle Coach.

## 2020-11-06 DIAGNOSIS — G245 Blepharospasm: Secondary | ICD-10-CM | POA: Diagnosis not present

## 2020-11-06 DIAGNOSIS — G4733 Obstructive sleep apnea (adult) (pediatric): Secondary | ICD-10-CM | POA: Diagnosis not present

## 2020-11-06 DIAGNOSIS — E119 Type 2 diabetes mellitus without complications: Secondary | ICD-10-CM | POA: Diagnosis not present

## 2020-11-07 DIAGNOSIS — M9901 Segmental and somatic dysfunction of cervical region: Secondary | ICD-10-CM | POA: Diagnosis not present

## 2020-11-07 DIAGNOSIS — M9903 Segmental and somatic dysfunction of lumbar region: Secondary | ICD-10-CM | POA: Diagnosis not present

## 2020-11-07 DIAGNOSIS — M542 Cervicalgia: Secondary | ICD-10-CM | POA: Diagnosis not present

## 2020-11-07 DIAGNOSIS — M9902 Segmental and somatic dysfunction of thoracic region: Secondary | ICD-10-CM | POA: Diagnosis not present

## 2020-11-14 DIAGNOSIS — H2513 Age-related nuclear cataract, bilateral: Secondary | ICD-10-CM | POA: Diagnosis not present

## 2020-11-14 DIAGNOSIS — H16223 Keratoconjunctivitis sicca, not specified as Sjogren's, bilateral: Secondary | ICD-10-CM | POA: Diagnosis not present

## 2020-11-14 DIAGNOSIS — G245 Blepharospasm: Secondary | ICD-10-CM | POA: Diagnosis not present

## 2020-11-16 ENCOUNTER — Encounter: Payer: Medicare Other | Attending: Family Medicine

## 2020-11-16 DIAGNOSIS — R7303 Prediabetes: Secondary | ICD-10-CM | POA: Insufficient documentation

## 2020-11-23 ENCOUNTER — Encounter (HOSPITAL_BASED_OUTPATIENT_CLINIC_OR_DEPARTMENT_OTHER): Payer: Medicare Other | Admitting: Registered"

## 2020-11-23 DIAGNOSIS — N400 Enlarged prostate without lower urinary tract symptoms: Secondary | ICD-10-CM | POA: Diagnosis not present

## 2020-11-23 DIAGNOSIS — Z713 Dietary counseling and surveillance: Secondary | ICD-10-CM

## 2020-11-23 DIAGNOSIS — E782 Mixed hyperlipidemia: Secondary | ICD-10-CM | POA: Diagnosis not present

## 2020-11-23 DIAGNOSIS — R7303 Prediabetes: Secondary | ICD-10-CM | POA: Diagnosis not present

## 2020-11-23 DIAGNOSIS — I10 Essential (primary) hypertension: Secondary | ICD-10-CM | POA: Diagnosis not present

## 2020-11-23 DIAGNOSIS — K219 Gastro-esophageal reflux disease without esophagitis: Secondary | ICD-10-CM | POA: Diagnosis not present

## 2020-11-23 DIAGNOSIS — N1831 Chronic kidney disease, stage 3a: Secondary | ICD-10-CM | POA: Diagnosis not present

## 2020-11-23 DIAGNOSIS — M170 Bilateral primary osteoarthritis of knee: Secondary | ICD-10-CM | POA: Diagnosis not present

## 2020-11-26 DIAGNOSIS — G245 Blepharospasm: Secondary | ICD-10-CM | POA: Diagnosis not present

## 2020-11-28 ENCOUNTER — Encounter: Payer: Self-pay | Admitting: Registered"

## 2020-11-28 NOTE — Progress Notes (Signed)
On 11/23/20 patient completed Core Session 15 of Diabetes Prevention Program course virtually with [facility]. By the end of this session patients are able to complete the following objectives:   Virtual Visit via Video Note  I connected with John Novak on 11/23/20 at 11:30 AM EDT by a video enabled application and verified that I am speaking with the correct person using two identifiers.  Location: Patient: Home.  Provider: Office.   Learning Objectives:  Explain how to prevent stress or cope with unavoidable stress.   Describe how this program can be a source of stress.   Explain how to manage stressful situations.   Create and follow an action plan for either preventing or coping with a stressful situation.   Goals:   Record weight taken outside of class.   Track foods and beverages eaten each day in the "Food and Activity Tracker," including calories and fat grams for each item.    Track activity type, minutes you were active, and distance you reached each day in the "Food and Activity Tracker."   Do your best to reach activity goal for the week.  Follow your action plan to reduce stress.   Answer questions on handout regarding success of action plan.   Follow-Up Plan:  Attend Core Session 16 next week.   Email completed "Food and Activity Tracker" before next week to be reviewed by Lifestyle Coach.

## 2020-11-30 ENCOUNTER — Encounter (HOSPITAL_BASED_OUTPATIENT_CLINIC_OR_DEPARTMENT_OTHER): Payer: Medicare Other | Admitting: Registered"

## 2020-11-30 ENCOUNTER — Encounter: Payer: Self-pay | Admitting: Registered"

## 2020-11-30 DIAGNOSIS — R7303 Prediabetes: Secondary | ICD-10-CM

## 2020-11-30 DIAGNOSIS — Z713 Dietary counseling and surveillance: Secondary | ICD-10-CM | POA: Diagnosis not present

## 2020-11-30 NOTE — Progress Notes (Signed)
On 11/30/20 patient completed Core Session 16 of Diabetes Prevention Program course virtually with [facility]. By the end of this session patients are able to complete the following objectives:   Virtual Visit via Video Note  I connected with John Novak on 11/30/20 at 11:30 AM EDT by a video enabled application and verified that I am speaking with the correct person using two identifiers.  Location: Patient: Home.  Provider: Office.   Learning Objectives:  Measure their progress toward weight and physical activity goals since Session 1.   Develop a plan for improving progress, if their goals have not yet been attained.   Describe ways to stay motivated long-term.   Goals:   Record weight taken outside of class.   Track foods and beverages eaten each day in the "Food and Activity Tracker," including calories and fat grams for each item.    Track activity type, minutes you were active, and distance you reached each day in the "Food and Activity Tracker."   Utilize action plan to help stay motivated and complete questions on "To Do List."   Follow-Up Plan:  Attend session 17 in two weeks.   Email completed "Food and Activity Tracker" before next session to be reviewed by Lifestyle Coach.

## 2020-12-11 DIAGNOSIS — Z23 Encounter for immunization: Secondary | ICD-10-CM | POA: Diagnosis not present

## 2020-12-28 ENCOUNTER — Encounter: Payer: Medicare Other | Attending: Family Medicine | Admitting: Registered"

## 2020-12-28 ENCOUNTER — Encounter: Payer: Self-pay | Admitting: Registered"

## 2020-12-28 DIAGNOSIS — R7303 Prediabetes: Secondary | ICD-10-CM | POA: Diagnosis not present

## 2020-12-28 NOTE — Progress Notes (Signed)
On 12/28/20 patient completed a post core session of the Diabetes Prevention Program course virtually with [facility]. By the end of this session patients are able to complete the following objectives:   Virtual Visit via Video Note  I connected with John Novak by a video enabled application and verified that I am speaking with the correct person using two identifiers.  Location: Patient: Home.  Provider: Office.   Learning Objectives: Identify how to maintain and/or continue working toward program goals for the remainder of the program.  Describe ways that food and activity tracking can assist them in maintaining/reaching program goals.  Identify progress they have made since the beginning of the program.   Goals:  Record weight taken outside of class.  Track foods and beverages eaten each day in the "Food and Activity Tracker," including calories and fat grams for each item.   Track activity type, minutes you were active, and distance you reached each day in the "Food and Activity Tracker."   Follow-Up Plan: Attend session 18 in two weeks.  Email completed "Food and Activity Trackers" before next session to be reviewed by Lifestyle Coach.

## 2021-01-11 ENCOUNTER — Encounter: Payer: Medicare Other | Attending: Family Medicine | Admitting: Registered"

## 2021-01-11 DIAGNOSIS — R7303 Prediabetes: Secondary | ICD-10-CM

## 2021-01-14 DIAGNOSIS — N400 Enlarged prostate without lower urinary tract symptoms: Secondary | ICD-10-CM | POA: Diagnosis not present

## 2021-01-14 DIAGNOSIS — K219 Gastro-esophageal reflux disease without esophagitis: Secondary | ICD-10-CM | POA: Diagnosis not present

## 2021-01-14 DIAGNOSIS — N1831 Chronic kidney disease, stage 3a: Secondary | ICD-10-CM | POA: Diagnosis not present

## 2021-01-14 DIAGNOSIS — I10 Essential (primary) hypertension: Secondary | ICD-10-CM | POA: Diagnosis not present

## 2021-01-14 DIAGNOSIS — M170 Bilateral primary osteoarthritis of knee: Secondary | ICD-10-CM | POA: Diagnosis not present

## 2021-01-14 DIAGNOSIS — E782 Mixed hyperlipidemia: Secondary | ICD-10-CM | POA: Diagnosis not present

## 2021-01-15 ENCOUNTER — Encounter: Payer: Self-pay | Admitting: Registered"

## 2021-01-15 NOTE — Progress Notes (Signed)
On 01/11/21 patient completed a post core session of Diabetes Prevention Program course virtually with [facility]. By the end of this session patients are able to complete the following objectives:   Virtual Visit via Video Note  I connected with John Novak by a video enabled application and verified that I am speaking with the correct person using two identifiers.  Location: Patient: Home.  Provider: Office.   Learning Objectives: Explain how glucose is used in the body and it's relationship with insulin/insulin resistance.  Identify symptoms of diabetes.  Describe lab tests used to diagnose diabetes.  Describe health complications and conditions related to diabetes.   Goals:  Record weight taken outside of class.  Track foods and beverages eaten each day in the "Food and Activity Tracker," including calories and fat grams for each item.   Track activity type, minutes you were active, and distance you reached each day in the "Food and Activity Tracker."   Follow-Up Plan: Attend next session.  Email completed "Food and Activity Trackers" before next session to be reviewed by Lifestyle Coach.

## 2021-01-25 ENCOUNTER — Encounter (HOSPITAL_BASED_OUTPATIENT_CLINIC_OR_DEPARTMENT_OTHER): Payer: Medicare Other | Admitting: Registered"

## 2021-01-25 ENCOUNTER — Encounter: Payer: Self-pay | Admitting: Registered"

## 2021-01-25 DIAGNOSIS — R7303 Prediabetes: Secondary | ICD-10-CM

## 2021-01-25 NOTE — Progress Notes (Signed)
On 01/25/21 patient completed a post core session of the Diabetes Prevention Program course virtually with [facility]. By the end of this session patients are able to complete the following objectives:   Virtual Visit via Video Note  I connected with John Novak by a video enabled application and verified that I am speaking with the correct person using two identifiers.  Location: Patient: Home.  Provider: Office.   Learning Objectives: Describe the importance of having regular meals each day and how skipping meals can negatively affect food choices and weight.  Plan out balanced meals and snacks. List ways to avoid unplanned snacking.   Goals:  Record weight taken outside of class.  Track foods and beverages eaten each day in the "Food and Activity Tracker," including calories and fat grams for each item.   Track activity type, minutes you were active, and distance you reached each day in the "Food and Activity Tracker."   Follow-Up Plan: Attend next session.  Email completed "Food and Activity Trackers" before next session to be reviewed by Lifestyle Coach.

## 2021-02-18 DIAGNOSIS — G245 Blepharospasm: Secondary | ICD-10-CM | POA: Diagnosis not present

## 2021-02-25 DIAGNOSIS — E782 Mixed hyperlipidemia: Secondary | ICD-10-CM | POA: Diagnosis not present

## 2021-02-25 DIAGNOSIS — K219 Gastro-esophageal reflux disease without esophagitis: Secondary | ICD-10-CM | POA: Diagnosis not present

## 2021-02-25 DIAGNOSIS — N1831 Chronic kidney disease, stage 3a: Secondary | ICD-10-CM | POA: Diagnosis not present

## 2021-02-25 DIAGNOSIS — M170 Bilateral primary osteoarthritis of knee: Secondary | ICD-10-CM | POA: Diagnosis not present

## 2021-02-25 DIAGNOSIS — N400 Enlarged prostate without lower urinary tract symptoms: Secondary | ICD-10-CM | POA: Diagnosis not present

## 2021-02-25 DIAGNOSIS — I10 Essential (primary) hypertension: Secondary | ICD-10-CM | POA: Diagnosis not present

## 2021-03-01 ENCOUNTER — Ambulatory Visit (HOSPITAL_BASED_OUTPATIENT_CLINIC_OR_DEPARTMENT_OTHER): Payer: Medicare Other | Admitting: Registered"

## 2021-03-01 DIAGNOSIS — R7303 Prediabetes: Secondary | ICD-10-CM | POA: Diagnosis not present

## 2021-03-05 DIAGNOSIS — K317 Polyp of stomach and duodenum: Secondary | ICD-10-CM | POA: Diagnosis not present

## 2021-03-05 DIAGNOSIS — K219 Gastro-esophageal reflux disease without esophagitis: Secondary | ICD-10-CM | POA: Diagnosis not present

## 2021-03-05 DIAGNOSIS — K5900 Constipation, unspecified: Secondary | ICD-10-CM | POA: Diagnosis not present

## 2021-03-05 DIAGNOSIS — Z86010 Personal history of colonic polyps: Secondary | ICD-10-CM | POA: Diagnosis not present

## 2021-03-06 ENCOUNTER — Telehealth: Payer: Self-pay | Admitting: Interventional Cardiology

## 2021-03-06 NOTE — Telephone Encounter (Signed)
Pt c/o BP issue: STAT if pt c/o blurred vision, one-sided weakness or slurred speech  1. What are your last 5 BP readings?  Currently 153/76 Pulse 55  2. Are you having any other symptoms (ex. Dizziness, headache, blurred vision, passed out)? No to all  3. What is your BP issue? BP was high yesterday when he went to see his Gertie Fey Dr. 152/70 Pulse 69  Pt wanted to see Dr. Tamala Julian so I offered him the appt for 03/07/21 '@9am'$  and he refused the appt, I  also tried to schedule his annual and he refused that appt as well.

## 2021-03-06 NOTE — Telephone Encounter (Signed)
Called pt in regards to elevated BP reading.  Pt expresses that his BP was elevated at Dr appointment yesterday (152/70)and today (153/76) .  Pt denies HA and dizziness.  Reports that he last took BP at home 2 weeks ago it was 137/69.  I advised pt to check BP daily for the next 3-5 days 1.5-2 hours after taking BP meds and keep a log.  He reports that he has an OV with Dr.Smith tomorrow.  I advised him that MD will address BP at OV.

## 2021-03-06 NOTE — Progress Notes (Signed)
Cardiology Office Note:    Date:  03/07/2021   ID:  John Novak, DOB [birth year], MRN [id number]  PCP:  Cari Caraway, MD  Cardiologist:  John Grooms, MD   Referring MD: Cari Caraway, MD   Chief Complaint  Patient presents with   Coronary Artery Disease   Hypertension    History of Present Illness:    John Novak is a 75 y.o. male with a hx of Non-obstructive CAD by cath in 2009 (dLM 101, mLAD 49, LCx 30-40, pRCA 20-30), Hypertension, Hyperlipidemia, OSA, and Gout.  He is doing well.  He denies angina.  His blood pressure spiked to consistent systolic recordings greater than 150.  An adjustment was made in his regimen by Dr. Addison Lank, increasing lisinopril to 60 mg daily.  He has had a marked improvement in blood pressure since that time.  He denies edema.  No orthostatic dizziness.  No cough or tongue swelling.  Primary prevention of ischemic events was discussed in detail.  Past Medical History:  Diagnosis Date   Anxiety    Arthritis    knees   Blepharospasm    bilateral, hx. "Meige syndrome" nerve issue- follow -up [facility] with Botox injections   Coronary atherosclerosis    Diverticulosis    no problems   Echocardiogram    Echo 11/19: EF 60-65, no RWMA, Gr 1 DD   Gout    Hyperlipidemia    Hypertension    Sleep apnea    automatic    Past Surgical History:  Procedure Laterality Date   BACK SURGERY     lower back   BROW LIFT AND BLEPHAROPLASTY Bilateral    CARDIAC CATHETERIZATION     negative for significant findings   INSERTION OF MESH N/A 06/23/2014   Procedure: INSERTION OF MESH;  Surgeon: Michael Boston, MD;  Location: [facility];  Service: General;  Laterality: N/A;   KNEE ARTHROSCOPY Bilateral    bilateral   SHOULDER ARTHROSCOPY W/ ROTATOR CUFF REPAIR Left    VENTRAL HERNIA REPAIR N/A 06/23/2014   Procedure: LAPAROSCOPIC VENTRAL WALL HERNIA REPAIR;  Surgeon: Michael Boston, MD;  Location: [facility];  Service: General;  Laterality: N/A;    Current  Medications: Current Meds  Medication Sig   allopurinol (ZYLOPRIM) 300 MG tablet Take 200 mg by mouth daily.   aspirin EC 81 MG tablet Take 1 tablet (81 mg total) by mouth daily.   Azelastine HCl 0.15 % SOLN SMARTSIG:1-2 Spray(s) Both Nares Twice Daily   Bepotastine Besilate 1.5 % SOLN Place 1 drop into both eyes daily.   celecoxib (CELEBREX) 200 MG capsule Take 200 mg by mouth daily as needed for mild pain.    clonazePAM (KLONOPIN) 0.5 MG tablet Take 0.5 mg by mouth 2 (two) times daily as needed for anxiety.   dexlansoprazole (DEXILANT) 60 MG capsule Take 60 mg by mouth daily.   diclofenac sodium (VOLTAREN) 1 % GEL Apply 1 application topically daily as needed (for pain).    fexofenadine (ALLEGRA) 60 MG tablet Take 60 mg by mouth daily as needed for allergies or rhinitis.   fluticasone (FLONASE) 50 MCG/ACT nasal spray Place 1 spray into both nostrils 2 (two) times daily.   GuaiFENesin (MUCINEX PO) Take 1 capsule by mouth daily.   Influenza vac split quadrivalent PF (FLUARIX) 0.5 ML injection Inject 0.5 mLs into the muscle once.   ipratropium (ATROVENT) 0.03 % nasal spray Place 2 sprays into both nostrils as needed.   lisinopril (PRINIVIL,ZESTRIL) 40 MG tablet  Take 60 mg by mouth every morning.   LORazepam (ATIVAN) 0.5 MG tablet Take 0.5-1 mg by mouth 3 (three) times daily. Takes '1mg'$  in the morning, 0.'5mg'$  at lunch, and 0.'5mg'$  in the evening   Multiple Vitamins-Minerals (PX COMPLETE SENIOR MULTIVITS) TABS Take 1 tablet by mouth daily.   Olopatadine HCl 0.6 % SOLN Place 2 puffs into both nostrils 2 (two) times daily.   Omega-3 1400 MG CAPS 2 capsules   OnabotulinumtoxinA (BOTOX IJ) Inject 100-200 Units as directed. Every 11 weeks patient gets 100 units around eyes. Every 22 weeks pt gets 200 units in face and neck  At [place] Take 1 tablet by mouth 2 (two) times daily. lipitropic Vitamin   OVER THE COUNTER MEDICATION Take 1 tablet by mouth 3  (three) times daily. Gout cure   Probiotic Product (PROBIOTIC DAILY) CAPS Take 1 capsule by mouth daily with breakfast.   Propylene Glycol 0.6 % SOLN Place 1 drop into both eyes every 4 (four) hours as needed (for dry eyes).   simvastatin (ZOCOR) 80 MG tablet Take 80 mg by mouth at bedtime.    tamsulosin (FLOMAX) 0.4 MG CAPS capsule Take 0.4 mg by mouth daily after supper.   zolpidem (AMBIEN) 10 MG tablet Take 10 mg by mouth at bedtime as needed for sleep.     Allergies:   Hydrocodone, Latex, and Penicillins   Social History   Socioeconomic History   Marital status: Married    Spouse name: Not on file   Number of children: Not on file   Years of education: Not on file   Highest education level: Not on file  Occupational History   Not on file  Tobacco Use   Smoking status: Never   Smokeless tobacco: Never  Vaping Use   Vaping Use: Never used  Substance and Sexual Activity   Alcohol use: No   Drug use: No   Sexual activity: Yes  Other Topics Concern   Not on file  Social History Narrative   Not on file   Social Determinants of Health   Financial Resource Strain: Not on file  Food Insecurity: Not on file  Transportation Needs: Not on file  Physical Activity: Not on file  Stress: Not on file  Social Connections: Not on file     Family History: The patient's Family history is unknown by patient.  ROS:   Please see the history of present illness.    Right is in the right knee.  Still getting some moderate aerobic activity.  All other systems reviewed and are negative.  EKGs/Labs/Other Studies Reviewed:    The following studies were reviewed today: No recent imaging  EKG:  EKG the tracing was not performed today.  Last tracing 2019.  This needs to be done on his next visit either here or with his primary care.  Recent Labs: No results found for requested labs within last 8760 hours.  Recent Lipid Panel No results found for: CHOL, TRIG, HDL, CHOLHDL, VLDL,  LDLCALC, LDLDIRECT  Physical Exam:    VS:  BP (!) 102/58   Pulse 62   Ht '5\' 11"'$  (1.803 m)   Wt 226 lb 9.6 oz (102.8 kg)   SpO2 98%   BMI 31.60 kg/m     Wt Readings from Last 3 Encounters:  03/07/21 226 lb 9.6 oz (102.8 kg)  05/04/19 244 lb (110.7 kg)  05/03/18 259 lb (117.5 kg)     GEN:  Overweight. No acute distress HEENT: Normal NECK: No JVD. LYMPHATICS: No lymphadenopathy CARDIAC: No murmur. RRR no gallop, or edema. VASCULAR:  Normal Pulses. No bruits. RESPIRATORY:  Clear to auscultation without rales, wheezing or rhonchi  ABDOMEN: Soft, non-tender, non-distended, No pulsatile mass, MUSCULOSKELETAL: No deformity  SKIN: Warm and dry NEUROLOGIC:  Alert and oriented x 3 PSYCHIATRIC:  Normal affect   ASSESSMENT:    1. Coronary artery disease involving native coronary artery of native heart without angina pectoris   2. Essential hypertension   3. Other hyperlipidemia    PLAN:    In order of problems listed above:  Primary prevention reiterated.  Physical activity discussed. Blood pressure is at target on high dose lisinopril.  Check basic metabolic panel in 7 to 10 days. Continue high intensity statin therapy.  Blood work to be done by Dr. Leonides Schanz in October.  Overall education and awareness concerning primary risk prevention was discussed in detail: LDL less than 70, hemoglobin A1c less than 7, blood pressure target less than 130/80 mmHg, >150 minutes of moderate aerobic activity per week, avoidance of smoking, weight control (via diet and exercise), and continued surveillance/management of/for obstructive sleep apnea.    Medication Adjustments/Labs and Tests Ordered: Current medicines are reviewed at length with the patient today.  Concerns regarding medicines are outlined above.  Orders Placed This Encounter  Procedures   Basic metabolic panel   No orders of the defined types were placed in this encounter.   Patient Instructions  Medication Instructions:   Your physician recommends that you continue on your current medications as directed. Please refer to the Current Medication list given to you today.  *If you need a refill on your cardiac medications before your next appointment, please call your pharmacy*   Lab Work: BMET in 1 week  If you have labs (blood work) drawn today and your tests are completely normal, you will receive your results only by: [place] (if you have MyChart) OR A paper copy in the mail If you have any lab test that is abnormal or we need to change your treatment, we will call you to review the results.   Testing/Procedures: None   Follow-Up: At [facility], you and your health needs are our priority.  As part of our continuing mission to provide you with exceptional heart care, we have created designated Provider Care Teams.  These Care Teams include your primary Cardiologist (physician) and Advanced Practice Providers (APPs -  Physician Assistants and Nurse Practitioners) who all work together to provide you with the care you need, when you need it.  We recommend signing up for the patient portal called "MyChart".  Sign up information is provided on this After Visit Summary.  MyChart is used to connect with patients for Virtual Visits (Telemedicine).  Patients are able to view lab/test results, encounter notes, upcoming appointments, etc.  Non-urgent messages can be sent to your provider as well.   To learn more about what you can do with MyChart, go to [email or web address].    Your next appointment:   1 year(s)  The format for your next appointment:   In Person  Provider:   You may see John Grooms, MD or one of the following Advanced Practice Providers on your designated Care Team:   Cecilie Kicks, NP   Other Instructions     Signed, John Grooms, MD  03/07/2021 9:29 AM    [place]

## 2021-03-07 ENCOUNTER — Encounter: Payer: Self-pay | Admitting: Interventional Cardiology

## 2021-03-07 ENCOUNTER — Encounter: Payer: Self-pay | Admitting: Registered"

## 2021-03-07 ENCOUNTER — Other Ambulatory Visit: Payer: Self-pay

## 2021-03-07 ENCOUNTER — Ambulatory Visit (INDEPENDENT_AMBULATORY_CARE_PROVIDER_SITE_OTHER): Payer: Medicare Other | Admitting: Interventional Cardiology

## 2021-03-07 VITALS — BP 102/58 | HR 62 | Ht 71.0 in | Wt 226.6 lb

## 2021-03-07 DIAGNOSIS — I10 Essential (primary) hypertension: Secondary | ICD-10-CM

## 2021-03-07 DIAGNOSIS — E7849 Other hyperlipidemia: Secondary | ICD-10-CM

## 2021-03-07 DIAGNOSIS — I2510 Atherosclerotic heart disease of native coronary artery without angina pectoris: Secondary | ICD-10-CM | POA: Diagnosis not present

## 2021-03-07 NOTE — Progress Notes (Signed)
On 03/01/21 patient completed a post core session of the Diabetes Prevention Program course virtually with [facility]. By the end of this session patients are able to complete the following objectives:   I connected with Burnis Medin by a video enabled application and verified that I am speaking with the correct person using two identifiers.  Location: Patient: Home.  Provider: Office.   Learning Objectives: Describe the difference between a lapse and a relapse. List steps to prevent a lapse from becoming a relapse.  Identify situations that increase risk of having a lapse.  Make a plan to help prevent lapses and recover after a lapse has occurred.   Goals:  Record weight taken outside of class.  Track foods and beverages eaten each day in the "Food and Activity Tracker," including calories and fat grams for each item.   Track activity type, minutes you were active, and distance you reached each day in the "Food and Activity Tracker."   Follow-Up Plan: Attend next session.  Email completed "Food and Activity Trackers" before next session to be reviewed by Lifestyle Coach.

## 2021-03-07 NOTE — Patient Instructions (Signed)
Medication Instructions:  Your physician recommends that you continue on your current medications as directed. Please refer to the Current Medication list given to you today.  *If you need a refill on your cardiac medications before your next appointment, please call your pharmacy*   Lab Work: BMET in 1 week  If you have labs (blood work) drawn today and your tests are completely normal, you will receive your results only by: [place] (if you have MyChart) OR A paper copy in the mail If you have any lab test that is abnormal or we need to change your treatment, we will call you to review the results.   Testing/Procedures: None   Follow-Up: At [facility], you and your health needs are our priority.  As part of our continuing mission to provide you with exceptional heart care, we have created designated Provider Care Teams.  These Care Teams include your primary Cardiologist (physician) and Advanced Practice Providers (APPs -  Physician Assistants and Nurse Practitioners) who all work together to provide you with the care you need, when you need it.  We recommend signing up for the patient portal called "MyChart".  Sign up information is provided on this After Visit Summary.  MyChart is used to connect with patients for Virtual Visits (Telemedicine).  Patients are able to view lab/test results, encounter notes, upcoming appointments, etc.  Non-urgent messages can be sent to your provider as well.   To learn more about what you can do with MyChart, go to [email or web address].    Your next appointment:   1 year(s)  The format for your next appointment:   In Person  Provider:   You may see Sinclair Grooms, MD or one of the following Advanced Practice Providers on your designated Care Team:   Cecilie Kicks, NP   Other Instructions

## 2021-03-15 ENCOUNTER — Encounter: Payer: Medicare Other | Attending: Family Medicine | Admitting: Registered"

## 2021-03-15 ENCOUNTER — Other Ambulatory Visit: Payer: Medicare Other | Admitting: *Deleted

## 2021-03-15 ENCOUNTER — Other Ambulatory Visit: Payer: Self-pay

## 2021-03-15 DIAGNOSIS — R7303 Prediabetes: Secondary | ICD-10-CM | POA: Insufficient documentation

## 2021-03-15 DIAGNOSIS — I10 Essential (primary) hypertension: Secondary | ICD-10-CM

## 2021-03-15 LAB — BASIC METABOLIC PANEL
BUN/Creatinine Ratio: 11 (ref 10–24)
BUN: 14 mg/dL (ref 8–27)
CO2: 27 mmol/L (ref 20–29)
Calcium: 10.2 mg/dL (ref 8.6–10.2)
Chloride: 99 mmol/L (ref 96–106)
Creatinine, Ser: 1.23 mg/dL (ref 0.76–1.27)
Glucose: 95 mg/dL (ref 65–99)
Potassium: 5 mmol/L (ref 3.5–5.2)
Sodium: 139 mmol/L (ref 134–144)
eGFR: 61 mL/min/{1.73_m2} (ref >59)

## 2021-03-18 ENCOUNTER — Other Ambulatory Visit: Payer: Self-pay

## 2021-03-19 DIAGNOSIS — D1801 Hemangioma of skin and subcutaneous tissue: Secondary | ICD-10-CM | POA: Diagnosis not present

## 2021-03-19 DIAGNOSIS — Z85828 Personal history of other malignant neoplasm of skin: Secondary | ICD-10-CM | POA: Diagnosis not present

## 2021-03-19 DIAGNOSIS — L821 Other seborrheic keratosis: Secondary | ICD-10-CM | POA: Diagnosis not present

## 2021-03-21 ENCOUNTER — Encounter: Payer: Self-pay | Admitting: Registered"

## 2021-03-21 NOTE — Progress Notes (Signed)
On 03/15/21 patient completed the Diabetes Prevention Program course virtually with [facility]. By the end of this session patients are able to complete the following objectives:   Virtual Visit via Video Note  I connected with Burnis Medin by a video enabled application and verified that I am speaking with the correct person using two identifiers.  Location: Patient: Home.  Provider: Office.   Learning Objectives: Define fiber and describe the difference between insoluble and soluble fiber  List foods that are good sources of fiber Explain the health benefits of fiber  Describe ways to increase volume of meals and snacks while staying within fat goal.   Goals:  Record weight taken outside of class.  Track foods and beverages eaten each day in the "Food and Activity Tracker," including calories and fat grams for each item.   Track activity type, minutes you were active, and distance you reached each day in the "Food and Activity Tracker."   Follow-Up Plan: Attend next session.  Email completed "Food and Activity Trackers" before next session to be reviewed by Lifestyle Coach.

## 2021-04-06 DIAGNOSIS — Z23 Encounter for immunization: Secondary | ICD-10-CM | POA: Diagnosis not present

## 2021-04-12 DIAGNOSIS — Z23 Encounter for immunization: Secondary | ICD-10-CM | POA: Diagnosis not present

## 2021-04-15 DIAGNOSIS — Z1389 Encounter for screening for other disorder: Secondary | ICD-10-CM | POA: Diagnosis not present

## 2021-04-15 DIAGNOSIS — Z0000 Encounter for general adult medical examination without abnormal findings: Secondary | ICD-10-CM | POA: Diagnosis not present

## 2021-04-16 DIAGNOSIS — R7309 Other abnormal glucose: Secondary | ICD-10-CM | POA: Diagnosis not present

## 2021-04-16 DIAGNOSIS — E663 Overweight: Secondary | ICD-10-CM | POA: Diagnosis not present

## 2021-04-16 DIAGNOSIS — M109 Gout, unspecified: Secondary | ICD-10-CM | POA: Diagnosis not present

## 2021-04-16 DIAGNOSIS — Z79899 Other long term (current) drug therapy: Secondary | ICD-10-CM | POA: Diagnosis not present

## 2021-04-16 DIAGNOSIS — E782 Mixed hyperlipidemia: Secondary | ICD-10-CM | POA: Diagnosis not present

## 2021-04-22 DIAGNOSIS — R7301 Impaired fasting glucose: Secondary | ICD-10-CM | POA: Diagnosis not present

## 2021-04-22 DIAGNOSIS — Z87898 Personal history of other specified conditions: Secondary | ICD-10-CM | POA: Diagnosis not present

## 2021-04-22 DIAGNOSIS — I10 Essential (primary) hypertension: Secondary | ICD-10-CM | POA: Diagnosis not present

## 2021-04-22 DIAGNOSIS — K219 Gastro-esophageal reflux disease without esophagitis: Secondary | ICD-10-CM | POA: Diagnosis not present

## 2021-04-22 DIAGNOSIS — M1711 Unilateral primary osteoarthritis, right knee: Secondary | ICD-10-CM | POA: Diagnosis not present

## 2021-04-22 DIAGNOSIS — N1831 Chronic kidney disease, stage 3a: Secondary | ICD-10-CM | POA: Diagnosis not present

## 2021-04-22 DIAGNOSIS — G4733 Obstructive sleep apnea (adult) (pediatric): Secondary | ICD-10-CM | POA: Diagnosis not present

## 2021-04-22 DIAGNOSIS — M109 Gout, unspecified: Secondary | ICD-10-CM | POA: Diagnosis not present

## 2021-04-22 DIAGNOSIS — E669 Obesity, unspecified: Secondary | ICD-10-CM | POA: Diagnosis not present

## 2021-04-22 DIAGNOSIS — Z86010 Personal history of colonic polyps: Secondary | ICD-10-CM | POA: Diagnosis not present

## 2021-04-22 DIAGNOSIS — E782 Mixed hyperlipidemia: Secondary | ICD-10-CM | POA: Diagnosis not present

## 2021-04-22 DIAGNOSIS — G479 Sleep disorder, unspecified: Secondary | ICD-10-CM | POA: Diagnosis not present

## 2021-05-03 ENCOUNTER — Ambulatory Visit
Admission: RE | Admit: 2021-05-03 | Discharge: 2021-05-03 | Disposition: A | Discharge status: Home / Self Care | Payer: Medicare Other | Source: Ambulatory Visit | Attending: Family Medicine | Admitting: Family Medicine

## 2021-05-03 ENCOUNTER — Other Ambulatory Visit: Payer: Self-pay | Admitting: Family Medicine

## 2021-05-03 ENCOUNTER — Other Ambulatory Visit: Payer: Self-pay

## 2021-05-03 DIAGNOSIS — M25561 Pain in right knee: Secondary | ICD-10-CM

## 2021-05-03 DIAGNOSIS — M1711 Unilateral primary osteoarthritis, right knee: Secondary | ICD-10-CM | POA: Diagnosis not present

## 2021-05-07 DIAGNOSIS — G244 Idiopathic orofacial dystonia: Secondary | ICD-10-CM | POA: Diagnosis not present

## 2021-05-07 DIAGNOSIS — G243 Spasmodic torticollis: Secondary | ICD-10-CM | POA: Diagnosis not present

## 2021-05-07 DIAGNOSIS — G245 Blepharospasm: Secondary | ICD-10-CM | POA: Diagnosis not present

## 2021-05-13 DIAGNOSIS — G245 Blepharospasm: Secondary | ICD-10-CM | POA: Diagnosis not present

## 2021-05-29 DIAGNOSIS — E782 Mixed hyperlipidemia: Secondary | ICD-10-CM | POA: Diagnosis not present

## 2021-05-29 DIAGNOSIS — M170 Bilateral primary osteoarthritis of knee: Secondary | ICD-10-CM | POA: Diagnosis not present

## 2021-05-29 DIAGNOSIS — I10 Essential (primary) hypertension: Secondary | ICD-10-CM | POA: Diagnosis not present

## 2021-05-29 DIAGNOSIS — M1711 Unilateral primary osteoarthritis, right knee: Secondary | ICD-10-CM | POA: Diagnosis not present

## 2021-05-29 DIAGNOSIS — N400 Enlarged prostate without lower urinary tract symptoms: Secondary | ICD-10-CM | POA: Diagnosis not present

## 2021-05-29 DIAGNOSIS — N1831 Chronic kidney disease, stage 3a: Secondary | ICD-10-CM | POA: Diagnosis not present

## 2021-05-29 DIAGNOSIS — K219 Gastro-esophageal reflux disease without esophagitis: Secondary | ICD-10-CM | POA: Diagnosis not present

## 2021-06-14 ENCOUNTER — Encounter: Payer: Medicare Other | Attending: Family Medicine | Admitting: Registered"

## 2021-06-14 DIAGNOSIS — R7303 Prediabetes: Secondary | ICD-10-CM

## 2021-06-21 ENCOUNTER — Encounter: Payer: Self-pay | Admitting: Registered"

## 2021-06-21 NOTE — Progress Notes (Signed)
On 06/14/21 patient completed a post core session of the Diabetes Prevention Program course virtually with [facility]. By the end of this session patients are able to complete the following objectives:   Virtual Visit via Video Note  I connected with John Novak by a video enabled application and verified that I am speaking with the correct person using two identifiers.  Location: Patient: Home.  Provider: Office.   Learning Objectives: Counter self-defeating thoughts with positive self-statements Define assertiveness.  List examples of ways to practice assertiveness.   Goals:  Record weight taken outside of class.  Track foods and beverages eaten each day in the "Food and Activity Tracker," including calories and fat grams for each item.   Track activity type, minutes you were active, and distance you reached each day in the "Food and Activity Tracker."   Follow-Up Plan: Attend next session.  Email completed "Food and Activity Trackers" before next session to be reviewed by Lifestyle Coach.

## 2021-07-19 ENCOUNTER — Encounter: Payer: Medicare Other | Attending: Family Medicine | Admitting: Registered"

## 2021-07-19 DIAGNOSIS — R7303 Prediabetes: Secondary | ICD-10-CM | POA: Insufficient documentation

## 2021-07-26 NOTE — Progress Notes (Addendum)
On 07/19/21 patient completed a post core session of the Diabetes Prevention Program course virtually with [facility]. By the end of this session patients are able to complete the following objectives:   Virtual Visit via Video Note  I connected with John Novak by a video enabled application and verified that I am speaking with the correct person using two identifiers.  I discussed the limitations of evaluation and management by telemedicine and the availability of in person appointments. The patient expressed understanding and agreed to proceed.  Location: Patient: Home.  Provider: Office.   Learning Objectives: Describe how to incorporate more fruits and vegetables into meals. List criteria for selecting good quality fruits and vegetables at the store.  Define mindful eating. List the benefits of eating mindfully.   Goals:  Record weight taken outside of class.  Track foods and beverages eaten each day in the "Food and Activity Tracker," including calories and fat grams for each item.   Track activity type, minutes you were active, and distance you reached each day in the "Food and Activity Tracker."   Follow-Up Plan: Attend next session.  Email completed "Food and Activity Trackers" before next session to be reviewed by Lifestyle Coach.

## 2021-08-02 ENCOUNTER — Encounter: Payer: Medicare Other | Admitting: Registered"

## 2021-08-08 DIAGNOSIS — G245 Blepharospasm: Secondary | ICD-10-CM | POA: Diagnosis not present

## 2021-08-20 DIAGNOSIS — J45991 Cough variant asthma: Secondary | ICD-10-CM | POA: Diagnosis not present

## 2021-08-20 DIAGNOSIS — H1045 Other chronic allergic conjunctivitis: Secondary | ICD-10-CM | POA: Diagnosis not present

## 2021-08-20 DIAGNOSIS — J301 Allergic rhinitis due to pollen: Secondary | ICD-10-CM | POA: Diagnosis not present

## 2021-08-20 DIAGNOSIS — J3089 Other allergic rhinitis: Secondary | ICD-10-CM | POA: Diagnosis not present

## 2021-09-01 ENCOUNTER — Other Ambulatory Visit: Payer: Self-pay

## 2021-09-01 ENCOUNTER — Emergency Department: Payer: Medicare Other

## 2021-09-01 ENCOUNTER — Emergency Department
Admission: EM | Admit: 2021-09-01 | Discharge: 2021-09-02 | Disposition: A | Discharge status: Home / Self Care | Payer: Medicare Other | Attending: Emergency Medicine | Admitting: Emergency Medicine

## 2021-09-01 DIAGNOSIS — I492 Junctional premature depolarization: Secondary | ICD-10-CM | POA: Diagnosis not present

## 2021-09-01 DIAGNOSIS — R9431 Abnormal electrocardiogram [ECG] [EKG]: Secondary | ICD-10-CM | POA: Diagnosis not present

## 2021-09-01 DIAGNOSIS — I10 Essential (primary) hypertension: Secondary | ICD-10-CM | POA: Diagnosis not present

## 2021-09-01 DIAGNOSIS — R079 Chest pain, unspecified: Secondary | ICD-10-CM | POA: Diagnosis not present

## 2021-09-01 DIAGNOSIS — R009 Unspecified abnormalities of heart beat: Secondary | ICD-10-CM | POA: Diagnosis present

## 2021-09-01 LAB — CBC
HCT: 41.7 % (ref 39.0–52.0)
Hemoglobin: 13.7 g/dL (ref 13.0–17.0)
MCH: 31.6 pg (ref 26.0–34.0)
MCHC: 32.9 g/dL (ref 30.0–36.0)
MCV: 96.1 fL (ref 80.0–100.0)
Platelets: 183 10*3/uL (ref 150–400)
RBC: 4.34 MIL/uL (ref 4.22–5.81)
RDW: 13.3 % (ref 11.5–15.5)
WBC: 6.1 10*3/uL (ref 4.0–10.5)
nRBC: 0 % (ref 0.0–0.2)

## 2021-09-01 LAB — TROPONIN I (HIGH SENSITIVITY)
Troponin I (High Sensitivity): 8 ng/L (ref <18)
Troponin I (High Sensitivity): 8 ng/L (ref <18)

## 2021-09-01 NOTE — ED Triage Notes (Signed)
Patient reports that feels like his heart beat is irregular. Started around 7 pm.  Reports has had this happen before.

## 2021-09-02 DIAGNOSIS — I10 Essential (primary) hypertension: Secondary | ICD-10-CM | POA: Diagnosis not present

## 2021-09-02 DIAGNOSIS — E782 Mixed hyperlipidemia: Secondary | ICD-10-CM | POA: Diagnosis not present

## 2021-09-02 LAB — TSH: TSH: 3.091 u[IU]/mL (ref 0.350–4.500)

## 2021-09-02 LAB — MAGNESIUM: Magnesium: 1.7 mg/dL (ref 1.7–2.4)

## 2021-09-02 MED ORDER — METOPROLOL TARTRATE 25 MG PO TABS: 25.0000 mg | ORAL_TABLET | Freq: Two times a day (BID) | ORAL | 0 refills | Status: DC | PRN

## 2021-09-02 NOTE — ED Provider Notes (Signed)
[facility] Provider Note    Event Date/Time   First MD Initiated Contact with Patient 09/01/21 2303     (approximate)   History   Irregular Heart Beat   HPI  John AULETTA is a 76 y.o. male with a history of hypertension hyperlipidemia who comes the ED complaining of irregular heartbeat that started about 7:00 PM, intermittent.  No aggravating or alleviating factors.  Does note that he has been under increased stress lately but no other unusual symptoms, not sick.  Has been active at his usual level of exertion.  No chest pain or shortness of breath.  No lightheadedness or syncope.  He does feel more tired than usual.  No cough or cold or decongestant use or stimulant use.  No excessive caffeine use.  No hot or cold intolerance or weight changes.       Physical Exam   Triage Vital Signs: ED Triage Vitals  Enc Vitals Group     BP 09/01/21 2123 (!) 169/66     Pulse Rate 09/01/21 2123 (!) 31     Resp 09/01/21 2123 20     Temp 09/01/21 2123 98.8 F (37.1 C)     Temp Source 09/01/21 2123 Oral     SpO2 09/01/21 2123 97 %     Weight 09/01/21 2120 226 lb (102.5 kg)     Height 09/01/21 2120 6' (1.829 m)     Head Circumference --      Peak Flow --      Pain Score 09/01/21 2120 0     Pain Loc --      Pain Edu? --      Excl. in [place]? --     Most recent vital signs: Vitals:   09/02/21 0030 09/02/21 0100  BP: (!) 141/80 140/68  Pulse: 67 68  Resp: (!) 24 (!) 23  Temp:    SpO2: 95% 96%     General: Awake, no distress.  CV:  Good peripheral perfusion.  Irregular rhythm, rate of 70.  Symmetric distal pulses.  Normal heart sounds Resp:  Normal effort.  Clear to auscultation bilaterally Abd:  No distention.  Soft nontender Other:  Moist oral mucosa.  Thyroid nonpalpable.   ED Results / Procedures / Treatments   Labs (all labs ordered are listed, but only abnormal results are displayed) Labs Reviewed  BASIC METABOLIC PANEL - Abnormal; Notable  for the following components:      Result Value   Creatinine, Ser 1.25 (*)    All other components within normal limits  CBC  MAGNESIUM  TSH  TROPONIN I (HIGH SENSITIVITY)  TROPONIN I (HIGH SENSITIVITY)     EKG  Interpreted by me Sinus rhythm rate of 76 with premature supraventricular complexes, likely junctional.  Left bundle branch block.  No ST changes.  Isolated T wave inversion in lead III which is nonspecific.   RADIOLOGY Chest x-ray viewed and interpreted by me, unremarkable.  Radiology report reviewed    PROCEDURES:  Critical Care performed: No  Procedures   MEDICATIONS ORDERED IN ED: Medications - No data to display   IMPRESSION / MDM / [place] / ED COURSE  I reviewed the triage vital signs and the nursing notes.                              Differential diagnosis includes, but is not limited to, electrolyte abnormality, hyperthyroidism, non-STEMI, anemia  *  The patient is on the cardiac monitor to evaluate for evidence of arrhythmia and/or significant heart rate changes.**}  Patient presents with palpitations, found to have PJCs on EKG and monitoring.  Heart rate is controlled, he is in a sinus rhythm predominantly with a rate of about 70.  Blood pressure is normal, no significant symptoms other than fatigue which is nonspecific.  Serial troponins are normal, electrolytes are normal.  TSH normal.  Patient is not requiring admission.  Recommend close follow-up with cardiology for further evaluation.  We will provide a prescription for metoprolol to be taken only if he notes tachycardia with worsening symptoms.     FINAL CLINICAL IMPRESSION(S) / ED DIAGNOSES   Final diagnoses:  Junctional premature beats ([place])     Rx / DC Orders   ED Discharge Orders          Ordered    metoprolol tartrate (LOPRESSOR) 25 MG tablet  2 times daily PRN        09/02/21 0230             Note:  This document was prepared using Dragon voice  recognition software and may include unintentional dictation errors.   Carrie Mew, MD 09/02/21 [phone]

## 2021-09-03 LAB — BASIC METABOLIC PANEL
Anion gap: 10 (ref 5–15)
BUN: 15 mg/dL (ref 8–23)
CO2: 29 mmol/L (ref 22–32)
Calcium: 9.6 mg/dL (ref 8.9–10.3)
Chloride: 103 mmol/L (ref 98–111)
Creatinine, Ser: 1.25 mg/dL — ABNORMAL HIGH (ref 0.61–1.24)
GFR, Estimated: >60 mL/min (ref >60)
Glucose, Bld: 92 mg/dL (ref 70–99)
Potassium: 4.3 mmol/L (ref 3.5–5.1)
Sodium: 142 mmol/L (ref 135–145)

## 2021-09-04 ENCOUNTER — Ambulatory Visit (INDEPENDENT_AMBULATORY_CARE_PROVIDER_SITE_OTHER): Payer: Medicare Other | Admitting: Interventional Cardiology

## 2021-09-04 ENCOUNTER — Other Ambulatory Visit: Payer: Self-pay

## 2021-09-04 ENCOUNTER — Encounter: Payer: Self-pay | Admitting: Interventional Cardiology

## 2021-09-04 VITALS — BP 118/62 | HR 68 | Ht 72.0 in | Wt 228.0 lb

## 2021-09-04 DIAGNOSIS — I491 Atrial premature depolarization: Secondary | ICD-10-CM | POA: Diagnosis not present

## 2021-09-04 DIAGNOSIS — E7849 Other hyperlipidemia: Secondary | ICD-10-CM

## 2021-09-04 DIAGNOSIS — R9431 Abnormal electrocardiogram [ECG] [EKG]: Secondary | ICD-10-CM

## 2021-09-04 DIAGNOSIS — I2510 Atherosclerotic heart disease of native coronary artery without angina pectoris: Secondary | ICD-10-CM | POA: Diagnosis not present

## 2021-09-04 DIAGNOSIS — I10 Essential (primary) hypertension: Secondary | ICD-10-CM

## 2021-09-04 NOTE — Progress Notes (Signed)
Cardiology Office Note:    Date:  09/04/2021   ID:  John Novak, DOB Mar 16, 1946, MRN [id number]  PCP:  Cari Caraway, MD  Cardiologist:  Sinclair Grooms, MD   Referring MD: Cari Caraway, MD   Chief Complaint  Patient presents with   Hypertension   Follow-up    Premature atrial contractions    History of Present Illness:    John Novak is a 76 y.o. male with a hx of Non-obstructive CAD by cath in 2009 (dLM 25, mLAD 21, LCx 30-40, pRCA 20-30), Hypertension, Hyperlipidemia, OSA, and Gout.  Seen in the [facility] Emergency room on 09/01/2021 with the following notation:John Novak is a 76 y.o. male with a history of hypertension hyperlipidemia who comes the ED complaining of irregular heartbeat that started about 7:00 PM, intermittent.  EKG revealed left bundle branch block with premature supraventricular beats.  He has not had any issues since the emergency room visit.  He states that the episode started after he had issues with indigestion, a CABG which always causes reflux, and was having some bowel issues.  He had to have box of trail mix that was loaded with salt.  The next day is when he noticed the palpitations.  No associated chest pain or dyspnea.  Went away spontaneously.  ER work-up was unremarkable.  He did not feel that he had any episodes of racing heart.  Past Medical History:  Diagnosis Date   Anxiety    Arthritis    knees   Blepharospasm    bilateral, hx. "Meige syndrome" nerve issue- follow -up [facility] with Botox injections   Coronary atherosclerosis    Diverticulosis    no problems   Echocardiogram    Echo 11/19: EF 60-65, no RWMA, Gr 1 DD   Gout    Hyperlipidemia    Hypertension    Sleep apnea    automatic    Past Surgical History:  Procedure Laterality Date   BACK SURGERY     lower back   BROW LIFT AND BLEPHAROPLASTY Bilateral    CARDIAC CATHETERIZATION     negative for significant findings   INSERTION OF MESH N/A 06/23/2014    Procedure: INSERTION OF MESH;  Surgeon: Michael Boston, MD;  Location: [facility];  Service: General;  Laterality: N/A;   KNEE ARTHROSCOPY Bilateral    bilateral   SHOULDER ARTHROSCOPY W/ ROTATOR CUFF REPAIR Left    VENTRAL HERNIA REPAIR N/A 06/23/2014   Procedure: LAPAROSCOPIC VENTRAL WALL HERNIA REPAIR;  Surgeon: Michael Boston, MD;  Location: [facility];  Service: General;  Laterality: N/A;    Current Medications: Current Meds  Medication Sig   albuterol (VENTOLIN HFA) 108 (90 Base) MCG/ACT inhaler SMARTSIG:2 Puff(s) By Mouth Every 4-6 Hours PRN   allopurinol (ZYLOPRIM) 100 MG tablet Take 200 mg by mouth daily.   aspirin 325 MG tablet Take 325 mg by mouth daily.   Bepotastine Besilate 1.5 % SOLN Place 1 drop into both eyes daily.   celecoxib (CELEBREX) 200 MG capsule Take 200 mg by mouth daily as needed for mild pain.    clonazePAM (KLONOPIN) 0.5 MG tablet Take 0.5 mg by mouth 2 (two) times daily as needed for anxiety.   dexlansoprazole (DEXILANT) 60 MG capsule Take 60 mg by mouth daily.   diclofenac sodium (VOLTAREN) 1 % GEL Apply 1 application topically daily as needed (for pain).    fluticasone (FLONASE) 50 MCG/ACT nasal spray Place 1 spray into both nostrils 2 (two) times  daily.   GuaiFENesin (MUCINEX PO) Take 1 capsule by mouth daily.   Influenza vac split quadrivalent PF (FLUARIX) 0.5 ML injection Inject 0.5 mLs into the muscle once.   ipratropium (ATROVENT) 0.03 % nasal spray Place 2 sprays into both nostrils as needed.   lisinopril (PRINIVIL,ZESTRIL) 40 MG tablet Take 60 mg by mouth every morning.   LORazepam (ATIVAN) 0.5 MG tablet Take 0.5-1 mg by mouth 3 (three) times daily. Takes 1mg  in the morning, 0.5mg  at lunch, and 0.5mg  in the evening   Multiple Vitamins-Minerals (PX COMPLETE SENIOR MULTIVITS) TABS Take 1 tablet by mouth daily.   Omega-3 1400 MG CAPS 2 capsules   OnabotulinumtoxinA (BOTOX IJ) Inject 100-200 Units as directed. Every 11 weeks patient gets 100 units around eyes. Every  22 weeks pt gets 200 units in face and neck  At [place] Take 1 tablet by mouth 2 (two) times daily. lipitropic Vitamin   OVER THE COUNTER MEDICATION Take 1 tablet by mouth 3 (three) times daily. Gout cure   Probiotic Product (PROBIOTIC DAILY) CAPS Take 1 capsule by mouth daily with breakfast.   Propylene Glycol 0.6 % SOLN Place 1 drop into both eyes every 4 (four) hours as needed (for dry eyes).   simvastatin (ZOCOR) 80 MG tablet Take 80 mg by mouth at bedtime.    tamsulosin (FLOMAX) 0.4 MG CAPS capsule Take 0.4 mg by mouth daily after supper.   zolpidem (AMBIEN) 10 MG tablet Take 10 mg by mouth at bedtime as needed for sleep.   [DISCONTINUED] metoprolol tartrate (LOPRESSOR) 25 MG tablet Take 1 tablet (25 mg total) by mouth 2 (two) times daily as needed.     Allergies:   Other, Hydrocodone, Latex, and Penicillins   Social History   Socioeconomic History   Marital status: Married    Spouse name: Not on file   Number of children: Not on file   Years of education: Not on file   Highest education level: Not on file  Occupational History   Not on file  Tobacco Use   Smoking status: Never   Smokeless tobacco: Never  Vaping Use   Vaping Use: Never used  Substance and Sexual Activity   Alcohol use: No   Drug use: No   Sexual activity: Yes  Other Topics Concern   Not on file  Social History Narrative   Not on file   Social Determinants of Health   Financial Resource Strain: Not on file  Food Insecurity: Not on file  Transportation Needs: Not on file  Physical Activity: Not on file  Stress: Not on file  Social Connections: Not on file     Family History: The patient's Family history is unknown by patient.  ROS:   Please see the history of present illness.    GI issues.  Has always had concerns about his health.  Measures his blood pressure on a regular basis.  Wonders if his blood pressure is too low today.  All other systems  reviewed and are negative.  EKGs/Labs/Other Studies Reviewed:    The following studies were reviewed today: Reviewed data from hospital encounter.  No abnormalities noted.  Creatinine was 1.25.  Potassium was normal.  EKG:  EKG performed in the [facility] emergency room on 09/01/2021 demonstrating right bundle with evidence of inferior and anterolateral Q waves with PACs at times in a bigeminal rhythm.  Recent Labs: 09/01/2021: BUN 15; Creatinine, Ser 1.25; Hemoglobin 13.7; Magnesium  1.7; Platelets 183; Potassium 4.3; Sodium 142; TSH 3.091  Recent Lipid Panel No results found for: CHOL, TRIG, HDL, CHOLHDL, VLDL, LDLCALC, LDLDIRECT  Physical Exam:    VS:  BP 118/62    Pulse 68    Ht 6' (1.829 m)    Wt 228 lb (103.4 kg)    SpO2 96%    BMI 30.92 kg/m     Wt Readings from Last 3 Encounters:  09/04/21 228 lb (103.4 kg)  09/01/21 226 lb (102.5 kg)  03/07/21 226 lb 9.6 oz (102.8 kg)     GEN: No pallor.  Able to lay flat.. No acute distress HEENT: Normal NECK: No JVD. LYMPHATICS: No lymphadenopathy CARDIAC: No murmur. RRR no gallop, or edema. VASCULAR:  normal Pulses. No bruits. RESPIRATORY:  Clear to auscultation without rales, wheezing or rhonchi  ABDOMEN: Soft, non-tender, non-distended, No pulsatile mass, MUSCULOSKELETAL: No deformity  SKIN: Warm and dry NEUROLOGIC:  Alert and oriented x 3 PSYCHIATRIC:  Normal affect   ASSESSMENT:    1. Coronary artery disease involving native coronary artery of native heart without angina pectoris   2. Essential hypertension   3. Other hyperlipidemia   4. Nonspecific abnormal electrocardiogram (ECG) (EKG)   5. Premature atrial complexes    PLAN:    In order of problems listed above:  Asymptomatic.  Secondary prevention reviewed.   Target blood pressure 130/80 mmHg. Continue simvastatin 80 mg/day.  Consider switching to rosuvastatin or atorvastatin. EKG from emergency room visit was reviewed.  A new tracing is not repeated.  On  exam rhythm is regular. No clinical evidence of prevalence/presents today.  Metoprolol tartrate 25 mg twice daily was recommended by the emergency room.  He never filled the medication.  I do not think he needs to start the medication.   Medication Adjustments/Labs and Tests Ordered: Current medicines are reviewed at length with the patient today.  Concerns regarding medicines are outlined above.  No orders of the defined types were placed in this encounter.  No orders of the defined types were placed in this encounter.   Patient Instructions  Medication Instructions:  Your physician recommends that you continue on your current medications as directed. Please refer to the Current Medication list given to you today.  *If you need a refill on your cardiac medications before your next appointment, please call your pharmacy*   Lab Work: None If you have labs (blood work) drawn today and your tests are completely normal, you will receive your results only by: [place] (if you have MyChart) OR A paper copy in the mail If you have any lab test that is abnormal or we need to change your treatment, we will call you to review the results.   Testing/Procedures: None   Follow-Up: At [facility], you and your health needs are our priority.  As part of our continuing mission to provide you with exceptional heart care, we have created designated Provider Care Teams.  These Care Teams include your primary Cardiologist (physician) and Advanced Practice Providers (APPs -  Physician Assistants and Nurse Practitioners) who all work together to provide you with the care you need, when you need it.  We recommend signing up for the patient portal called "MyChart".  Sign up information is provided on this After Visit Summary.  MyChart is used to connect with patients for Virtual Visits (Telemedicine).  Patients are able to view lab/test results, encounter notes, upcoming appointments, etc.   Non-urgent messages can be sent to your provider  as well.   To learn more about what you can do with MyChart, go to [email or web address].    Your next appointment:   1 year(s)  The format for your next appointment:   In Person  Provider:   Sinclair Grooms, MD     Other Instructions     Signed, Sinclair Grooms, MD  09/04/2021 2:03 PM    [place]

## 2021-09-04 NOTE — Patient Instructions (Signed)

## 2021-09-13 DIAGNOSIS — M9902 Segmental and somatic dysfunction of thoracic region: Secondary | ICD-10-CM | POA: Diagnosis not present

## 2021-09-13 DIAGNOSIS — M5136 Other intervertebral disc degeneration, lumbar region: Secondary | ICD-10-CM | POA: Diagnosis not present

## 2021-09-13 DIAGNOSIS — M5134 Other intervertebral disc degeneration, thoracic region: Secondary | ICD-10-CM | POA: Diagnosis not present

## 2021-09-13 DIAGNOSIS — M9903 Segmental and somatic dysfunction of lumbar region: Secondary | ICD-10-CM | POA: Diagnosis not present

## 2021-09-16 DIAGNOSIS — M5134 Other intervertebral disc degeneration, thoracic region: Secondary | ICD-10-CM | POA: Diagnosis not present

## 2021-09-16 DIAGNOSIS — M9903 Segmental and somatic dysfunction of lumbar region: Secondary | ICD-10-CM | POA: Diagnosis not present

## 2021-09-16 DIAGNOSIS — M9902 Segmental and somatic dysfunction of thoracic region: Secondary | ICD-10-CM | POA: Diagnosis not present

## 2021-09-16 DIAGNOSIS — M5136 Other intervertebral disc degeneration, lumbar region: Secondary | ICD-10-CM | POA: Diagnosis not present

## 2021-09-17 DIAGNOSIS — M5134 Other intervertebral disc degeneration, thoracic region: Secondary | ICD-10-CM | POA: Diagnosis not present

## 2021-09-17 DIAGNOSIS — M9902 Segmental and somatic dysfunction of thoracic region: Secondary | ICD-10-CM | POA: Diagnosis not present

## 2021-09-17 DIAGNOSIS — M9903 Segmental and somatic dysfunction of lumbar region: Secondary | ICD-10-CM | POA: Diagnosis not present

## 2021-09-17 DIAGNOSIS — M5136 Other intervertebral disc degeneration, lumbar region: Secondary | ICD-10-CM | POA: Diagnosis not present

## 2021-09-20 DIAGNOSIS — M5136 Other intervertebral disc degeneration, lumbar region: Secondary | ICD-10-CM | POA: Diagnosis not present

## 2021-09-20 DIAGNOSIS — M9903 Segmental and somatic dysfunction of lumbar region: Secondary | ICD-10-CM | POA: Diagnosis not present

## 2021-09-20 DIAGNOSIS — M5134 Other intervertebral disc degeneration, thoracic region: Secondary | ICD-10-CM | POA: Diagnosis not present

## 2021-09-20 DIAGNOSIS — M9902 Segmental and somatic dysfunction of thoracic region: Secondary | ICD-10-CM | POA: Diagnosis not present

## 2021-09-23 DIAGNOSIS — M9902 Segmental and somatic dysfunction of thoracic region: Secondary | ICD-10-CM | POA: Diagnosis not present

## 2021-09-23 DIAGNOSIS — M5134 Other intervertebral disc degeneration, thoracic region: Secondary | ICD-10-CM | POA: Diagnosis not present

## 2021-09-23 DIAGNOSIS — M5136 Other intervertebral disc degeneration, lumbar region: Secondary | ICD-10-CM | POA: Diagnosis not present

## 2021-09-23 DIAGNOSIS — M9903 Segmental and somatic dysfunction of lumbar region: Secondary | ICD-10-CM | POA: Diagnosis not present

## 2021-09-25 DIAGNOSIS — M9903 Segmental and somatic dysfunction of lumbar region: Secondary | ICD-10-CM | POA: Diagnosis not present

## 2021-09-25 DIAGNOSIS — M9902 Segmental and somatic dysfunction of thoracic region: Secondary | ICD-10-CM | POA: Diagnosis not present

## 2021-09-25 DIAGNOSIS — M5136 Other intervertebral disc degeneration, lumbar region: Secondary | ICD-10-CM | POA: Diagnosis not present

## 2021-09-25 DIAGNOSIS — M5134 Other intervertebral disc degeneration, thoracic region: Secondary | ICD-10-CM | POA: Diagnosis not present

## 2021-09-27 DIAGNOSIS — M5134 Other intervertebral disc degeneration, thoracic region: Secondary | ICD-10-CM | POA: Diagnosis not present

## 2021-09-27 DIAGNOSIS — M9902 Segmental and somatic dysfunction of thoracic region: Secondary | ICD-10-CM | POA: Diagnosis not present

## 2021-09-27 DIAGNOSIS — M5136 Other intervertebral disc degeneration, lumbar region: Secondary | ICD-10-CM | POA: Diagnosis not present

## 2021-09-27 DIAGNOSIS — M9903 Segmental and somatic dysfunction of lumbar region: Secondary | ICD-10-CM | POA: Diagnosis not present

## 2021-09-30 DIAGNOSIS — M5134 Other intervertebral disc degeneration, thoracic region: Secondary | ICD-10-CM | POA: Diagnosis not present

## 2021-09-30 DIAGNOSIS — M9902 Segmental and somatic dysfunction of thoracic region: Secondary | ICD-10-CM | POA: Diagnosis not present

## 2021-09-30 DIAGNOSIS — M9903 Segmental and somatic dysfunction of lumbar region: Secondary | ICD-10-CM | POA: Diagnosis not present

## 2021-09-30 DIAGNOSIS — M5136 Other intervertebral disc degeneration, lumbar region: Secondary | ICD-10-CM | POA: Diagnosis not present

## 2021-10-02 DIAGNOSIS — M5134 Other intervertebral disc degeneration, thoracic region: Secondary | ICD-10-CM | POA: Diagnosis not present

## 2021-10-02 DIAGNOSIS — M9903 Segmental and somatic dysfunction of lumbar region: Secondary | ICD-10-CM | POA: Diagnosis not present

## 2021-10-02 DIAGNOSIS — M9902 Segmental and somatic dysfunction of thoracic region: Secondary | ICD-10-CM | POA: Diagnosis not present

## 2021-10-02 DIAGNOSIS — M5136 Other intervertebral disc degeneration, lumbar region: Secondary | ICD-10-CM | POA: Diagnosis not present

## 2021-10-04 DIAGNOSIS — M9902 Segmental and somatic dysfunction of thoracic region: Secondary | ICD-10-CM | POA: Diagnosis not present

## 2021-10-04 DIAGNOSIS — M5134 Other intervertebral disc degeneration, thoracic region: Secondary | ICD-10-CM | POA: Diagnosis not present

## 2021-10-04 DIAGNOSIS — M9903 Segmental and somatic dysfunction of lumbar region: Secondary | ICD-10-CM | POA: Diagnosis not present

## 2021-10-04 DIAGNOSIS — M5136 Other intervertebral disc degeneration, lumbar region: Secondary | ICD-10-CM | POA: Diagnosis not present

## 2021-10-07 DIAGNOSIS — M5134 Other intervertebral disc degeneration, thoracic region: Secondary | ICD-10-CM | POA: Diagnosis not present

## 2021-10-07 DIAGNOSIS — M9903 Segmental and somatic dysfunction of lumbar region: Secondary | ICD-10-CM | POA: Diagnosis not present

## 2021-10-07 DIAGNOSIS — M5136 Other intervertebral disc degeneration, lumbar region: Secondary | ICD-10-CM | POA: Diagnosis not present

## 2021-10-07 DIAGNOSIS — G244 Idiopathic orofacial dystonia: Secondary | ICD-10-CM | POA: Diagnosis not present

## 2021-10-07 DIAGNOSIS — G243 Spasmodic torticollis: Secondary | ICD-10-CM | POA: Diagnosis not present

## 2021-10-07 DIAGNOSIS — G245 Blepharospasm: Secondary | ICD-10-CM | POA: Diagnosis not present

## 2021-10-07 DIAGNOSIS — M9902 Segmental and somatic dysfunction of thoracic region: Secondary | ICD-10-CM | POA: Diagnosis not present

## 2021-10-09 DIAGNOSIS — M9903 Segmental and somatic dysfunction of lumbar region: Secondary | ICD-10-CM | POA: Diagnosis not present

## 2021-10-09 DIAGNOSIS — M5136 Other intervertebral disc degeneration, lumbar region: Secondary | ICD-10-CM | POA: Diagnosis not present

## 2021-10-09 DIAGNOSIS — M9902 Segmental and somatic dysfunction of thoracic region: Secondary | ICD-10-CM | POA: Diagnosis not present

## 2021-10-09 DIAGNOSIS — M5134 Other intervertebral disc degeneration, thoracic region: Secondary | ICD-10-CM | POA: Diagnosis not present

## 2021-10-11 DIAGNOSIS — M5136 Other intervertebral disc degeneration, lumbar region: Secondary | ICD-10-CM | POA: Diagnosis not present

## 2021-10-11 DIAGNOSIS — M9902 Segmental and somatic dysfunction of thoracic region: Secondary | ICD-10-CM | POA: Diagnosis not present

## 2021-10-11 DIAGNOSIS — M5134 Other intervertebral disc degeneration, thoracic region: Secondary | ICD-10-CM | POA: Diagnosis not present

## 2021-10-11 DIAGNOSIS — M9903 Segmental and somatic dysfunction of lumbar region: Secondary | ICD-10-CM | POA: Diagnosis not present

## 2021-10-16 DIAGNOSIS — R7301 Impaired fasting glucose: Secondary | ICD-10-CM | POA: Diagnosis not present

## 2021-10-16 DIAGNOSIS — M5134 Other intervertebral disc degeneration, thoracic region: Secondary | ICD-10-CM | POA: Diagnosis not present

## 2021-10-16 DIAGNOSIS — M5136 Other intervertebral disc degeneration, lumbar region: Secondary | ICD-10-CM | POA: Diagnosis not present

## 2021-10-16 DIAGNOSIS — M9902 Segmental and somatic dysfunction of thoracic region: Secondary | ICD-10-CM | POA: Diagnosis not present

## 2021-10-16 DIAGNOSIS — M9903 Segmental and somatic dysfunction of lumbar region: Secondary | ICD-10-CM | POA: Diagnosis not present

## 2021-10-21 DIAGNOSIS — H43813 Vitreous degeneration, bilateral: Secondary | ICD-10-CM | POA: Diagnosis not present

## 2021-10-21 DIAGNOSIS — H25813 Combined forms of age-related cataract, bilateral: Secondary | ICD-10-CM | POA: Diagnosis not present

## 2021-10-22 DIAGNOSIS — I10 Essential (primary) hypertension: Secondary | ICD-10-CM | POA: Diagnosis not present

## 2021-10-22 DIAGNOSIS — J309 Allergic rhinitis, unspecified: Secondary | ICD-10-CM | POA: Diagnosis not present

## 2021-10-22 DIAGNOSIS — G479 Sleep disorder, unspecified: Secondary | ICD-10-CM | POA: Diagnosis not present

## 2021-10-22 DIAGNOSIS — M109 Gout, unspecified: Secondary | ICD-10-CM | POA: Diagnosis not present

## 2021-10-22 DIAGNOSIS — R7309 Other abnormal glucose: Secondary | ICD-10-CM | POA: Diagnosis not present

## 2021-10-22 DIAGNOSIS — G244 Idiopathic orofacial dystonia: Secondary | ICD-10-CM | POA: Diagnosis not present

## 2021-10-22 DIAGNOSIS — K219 Gastro-esophageal reflux disease without esophagitis: Secondary | ICD-10-CM | POA: Diagnosis not present

## 2021-10-22 DIAGNOSIS — N3281 Overactive bladder: Secondary | ICD-10-CM | POA: Diagnosis not present

## 2021-10-22 DIAGNOSIS — G4733 Obstructive sleep apnea (adult) (pediatric): Secondary | ICD-10-CM | POA: Diagnosis not present

## 2021-10-22 DIAGNOSIS — E782 Mixed hyperlipidemia: Secondary | ICD-10-CM | POA: Diagnosis not present

## 2021-10-22 DIAGNOSIS — M1711 Unilateral primary osteoarthritis, right knee: Secondary | ICD-10-CM | POA: Diagnosis not present

## 2021-10-22 DIAGNOSIS — N400 Enlarged prostate without lower urinary tract symptoms: Secondary | ICD-10-CM | POA: Diagnosis not present

## 2021-11-04 DIAGNOSIS — G245 Blepharospasm: Secondary | ICD-10-CM | POA: Diagnosis not present

## 2021-11-18 DIAGNOSIS — G4733 Obstructive sleep apnea (adult) (pediatric): Secondary | ICD-10-CM | POA: Diagnosis not present

## 2021-11-20 DIAGNOSIS — H16223 Keratoconjunctivitis sicca, not specified as Sjogren's, bilateral: Secondary | ICD-10-CM | POA: Diagnosis not present

## 2021-11-20 DIAGNOSIS — G245 Blepharospasm: Secondary | ICD-10-CM | POA: Diagnosis not present

## 2021-11-20 DIAGNOSIS — H2513 Age-related nuclear cataract, bilateral: Secondary | ICD-10-CM | POA: Diagnosis not present

## 2021-12-30 DIAGNOSIS — Z23 Encounter for immunization: Secondary | ICD-10-CM | POA: Diagnosis not present

## 2022-01-20 DIAGNOSIS — G245 Blepharospasm: Secondary | ICD-10-CM | POA: Diagnosis not present

## 2022-03-19 DIAGNOSIS — L718 Other rosacea: Secondary | ICD-10-CM | POA: Diagnosis not present

## 2022-03-19 DIAGNOSIS — D225 Melanocytic nevi of trunk: Secondary | ICD-10-CM | POA: Diagnosis not present

## 2022-03-19 DIAGNOSIS — D2261 Melanocytic nevi of right upper limb, including shoulder: Secondary | ICD-10-CM | POA: Diagnosis not present

## 2022-03-19 DIAGNOSIS — L821 Other seborrheic keratosis: Secondary | ICD-10-CM | POA: Diagnosis not present

## 2022-03-19 DIAGNOSIS — Z23 Encounter for immunization: Secondary | ICD-10-CM | POA: Diagnosis not present

## 2022-03-19 DIAGNOSIS — L570 Actinic keratosis: Secondary | ICD-10-CM | POA: Diagnosis not present

## 2022-03-19 DIAGNOSIS — Z85828 Personal history of other malignant neoplasm of skin: Secondary | ICD-10-CM | POA: Diagnosis not present

## 2022-03-19 DIAGNOSIS — D2262 Melanocytic nevi of left upper limb, including shoulder: Secondary | ICD-10-CM | POA: Diagnosis not present

## 2022-03-25 DIAGNOSIS — G244 Idiopathic orofacial dystonia: Secondary | ICD-10-CM | POA: Diagnosis not present

## 2022-03-25 DIAGNOSIS — G245 Blepharospasm: Secondary | ICD-10-CM | POA: Diagnosis not present

## 2022-03-25 DIAGNOSIS — M2653 Deviation in opening and closing of the mandible: Secondary | ICD-10-CM | POA: Diagnosis not present

## 2022-03-25 DIAGNOSIS — G243 Spasmodic torticollis: Secondary | ICD-10-CM | POA: Diagnosis not present

## 2022-04-07 DIAGNOSIS — G245 Blepharospasm: Secondary | ICD-10-CM | POA: Diagnosis not present

## 2022-04-09 DIAGNOSIS — Z23 Encounter for immunization: Secondary | ICD-10-CM | POA: Diagnosis not present

## 2022-04-15 DIAGNOSIS — H25813 Combined forms of age-related cataract, bilateral: Secondary | ICD-10-CM | POA: Diagnosis not present

## 2022-04-16 DIAGNOSIS — Z0000 Encounter for general adult medical examination without abnormal findings: Secondary | ICD-10-CM | POA: Diagnosis not present

## 2022-04-16 DIAGNOSIS — Z1389 Encounter for screening for other disorder: Secondary | ICD-10-CM | POA: Diagnosis not present

## 2022-04-23 DIAGNOSIS — R7301 Impaired fasting glucose: Secondary | ICD-10-CM | POA: Diagnosis not present

## 2022-04-23 DIAGNOSIS — E782 Mixed hyperlipidemia: Secondary | ICD-10-CM | POA: Diagnosis not present

## 2022-04-23 DIAGNOSIS — I10 Essential (primary) hypertension: Secondary | ICD-10-CM | POA: Diagnosis not present

## 2022-04-23 DIAGNOSIS — M109 Gout, unspecified: Secondary | ICD-10-CM | POA: Diagnosis not present

## 2022-04-23 DIAGNOSIS — R7309 Other abnormal glucose: Secondary | ICD-10-CM | POA: Diagnosis not present

## 2022-04-23 DIAGNOSIS — Z79899 Other long term (current) drug therapy: Secondary | ICD-10-CM | POA: Diagnosis not present

## 2022-04-30 DIAGNOSIS — Z6833 Body mass index (BMI) 33.0-33.9, adult: Secondary | ICD-10-CM | POA: Diagnosis not present

## 2022-04-30 DIAGNOSIS — R7309 Other abnormal glucose: Secondary | ICD-10-CM | POA: Diagnosis not present

## 2022-04-30 DIAGNOSIS — I872 Venous insufficiency (chronic) (peripheral): Secondary | ICD-10-CM | POA: Diagnosis not present

## 2022-04-30 DIAGNOSIS — E782 Mixed hyperlipidemia: Secondary | ICD-10-CM | POA: Diagnosis not present

## 2022-04-30 DIAGNOSIS — L989 Disorder of the skin and subcutaneous tissue, unspecified: Secondary | ICD-10-CM | POA: Diagnosis not present

## 2022-04-30 DIAGNOSIS — K219 Gastro-esophageal reflux disease without esophagitis: Secondary | ICD-10-CM | POA: Diagnosis not present

## 2022-04-30 DIAGNOSIS — G4733 Obstructive sleep apnea (adult) (pediatric): Secondary | ICD-10-CM | POA: Diagnosis not present

## 2022-04-30 DIAGNOSIS — M170 Bilateral primary osteoarthritis of knee: Secondary | ICD-10-CM | POA: Diagnosis not present

## 2022-04-30 DIAGNOSIS — N400 Enlarged prostate without lower urinary tract symptoms: Secondary | ICD-10-CM | POA: Diagnosis not present

## 2022-04-30 DIAGNOSIS — Z23 Encounter for immunization: Secondary | ICD-10-CM | POA: Diagnosis not present

## 2022-04-30 DIAGNOSIS — I10 Essential (primary) hypertension: Secondary | ICD-10-CM | POA: Diagnosis not present

## 2022-04-30 DIAGNOSIS — M109 Gout, unspecified: Secondary | ICD-10-CM | POA: Diagnosis not present

## 2022-05-12 DIAGNOSIS — K5900 Constipation, unspecified: Secondary | ICD-10-CM | POA: Diagnosis not present

## 2022-05-12 DIAGNOSIS — K219 Gastro-esophageal reflux disease without esophagitis: Secondary | ICD-10-CM | POA: Diagnosis not present

## 2022-05-12 DIAGNOSIS — K921 Melena: Secondary | ICD-10-CM | POA: Diagnosis not present

## 2022-06-16 DIAGNOSIS — M25552 Pain in left hip: Secondary | ICD-10-CM | POA: Diagnosis not present

## 2022-06-16 DIAGNOSIS — M1612 Unilateral primary osteoarthritis, left hip: Secondary | ICD-10-CM | POA: Diagnosis not present

## 2022-07-10 DIAGNOSIS — G245 Blepharospasm: Secondary | ICD-10-CM | POA: Diagnosis not present

## 2022-08-19 DIAGNOSIS — G245 Blepharospasm: Secondary | ICD-10-CM | POA: Diagnosis not present

## 2022-08-19 DIAGNOSIS — G244 Idiopathic orofacial dystonia: Secondary | ICD-10-CM | POA: Diagnosis not present

## 2022-09-17 DIAGNOSIS — K5900 Constipation, unspecified: Secondary | ICD-10-CM | POA: Diagnosis not present

## 2022-09-17 DIAGNOSIS — K5730 Diverticulosis of large intestine without perforation or abscess without bleeding: Secondary | ICD-10-CM | POA: Diagnosis not present

## 2022-09-17 DIAGNOSIS — K921 Melena: Secondary | ICD-10-CM | POA: Diagnosis not present

## 2022-09-17 DIAGNOSIS — D120 Benign neoplasm of cecum: Secondary | ICD-10-CM | POA: Diagnosis not present

## 2022-09-17 DIAGNOSIS — K648 Other hemorrhoids: Secondary | ICD-10-CM | POA: Diagnosis not present

## 2022-09-17 DIAGNOSIS — Z86010 Personal history of colonic polyps: Secondary | ICD-10-CM | POA: Diagnosis not present

## 2022-09-19 DIAGNOSIS — D120 Benign neoplasm of cecum: Secondary | ICD-10-CM | POA: Diagnosis not present

## 2022-09-29 DIAGNOSIS — G245 Blepharospasm: Secondary | ICD-10-CM | POA: Diagnosis not present

## 2022-10-28 DIAGNOSIS — J3089 Other allergic rhinitis: Secondary | ICD-10-CM | POA: Diagnosis not present

## 2022-10-28 DIAGNOSIS — J45991 Cough variant asthma: Secondary | ICD-10-CM | POA: Diagnosis not present

## 2022-10-28 DIAGNOSIS — H1045 Other chronic allergic conjunctivitis: Secondary | ICD-10-CM | POA: Diagnosis not present

## 2022-10-28 DIAGNOSIS — J301 Allergic rhinitis due to pollen: Secondary | ICD-10-CM | POA: Diagnosis not present

## 2022-11-27 DIAGNOSIS — R7309 Other abnormal glucose: Secondary | ICD-10-CM | POA: Diagnosis not present

## 2022-11-27 DIAGNOSIS — R7303 Prediabetes: Secondary | ICD-10-CM | POA: Diagnosis not present

## 2022-12-10 DIAGNOSIS — I10 Essential (primary) hypertension: Secondary | ICD-10-CM | POA: Diagnosis not present

## 2022-12-10 DIAGNOSIS — M25551 Pain in right hip: Secondary | ICD-10-CM | POA: Diagnosis not present

## 2022-12-10 DIAGNOSIS — R7309 Other abnormal glucose: Secondary | ICD-10-CM | POA: Diagnosis not present

## 2022-12-10 DIAGNOSIS — M25552 Pain in left hip: Secondary | ICD-10-CM | POA: Diagnosis not present

## 2022-12-10 DIAGNOSIS — E782 Mixed hyperlipidemia: Secondary | ICD-10-CM | POA: Diagnosis not present

## 2022-12-10 DIAGNOSIS — E669 Obesity, unspecified: Secondary | ICD-10-CM | POA: Diagnosis not present

## 2022-12-10 DIAGNOSIS — G244 Idiopathic orofacial dystonia: Secondary | ICD-10-CM | POA: Diagnosis not present

## 2022-12-10 DIAGNOSIS — M109 Gout, unspecified: Secondary | ICD-10-CM | POA: Diagnosis not present

## 2022-12-10 DIAGNOSIS — K219 Gastro-esophageal reflux disease without esophagitis: Secondary | ICD-10-CM | POA: Diagnosis not present

## 2022-12-10 DIAGNOSIS — G479 Sleep disorder, unspecified: Secondary | ICD-10-CM | POA: Diagnosis not present

## 2022-12-10 DIAGNOSIS — L03115 Cellulitis of right lower limb: Secondary | ICD-10-CM | POA: Diagnosis not present

## 2022-12-10 DIAGNOSIS — G4733 Obstructive sleep apnea (adult) (pediatric): Secondary | ICD-10-CM | POA: Diagnosis not present

## 2022-12-15 DIAGNOSIS — G4733 Obstructive sleep apnea (adult) (pediatric): Secondary | ICD-10-CM | POA: Diagnosis not present

## 2022-12-16 DIAGNOSIS — G245 Blepharospasm: Secondary | ICD-10-CM | POA: Diagnosis not present

## 2023-01-02 DIAGNOSIS — L02415 Cutaneous abscess of right lower limb: Secondary | ICD-10-CM | POA: Diagnosis not present

## 2023-01-13 DIAGNOSIS — G244 Idiopathic orofacial dystonia: Secondary | ICD-10-CM | POA: Diagnosis not present

## 2023-01-13 DIAGNOSIS — G243 Spasmodic torticollis: Secondary | ICD-10-CM | POA: Diagnosis not present

## 2023-01-13 DIAGNOSIS — G245 Blepharospasm: Secondary | ICD-10-CM | POA: Diagnosis not present

## 2023-01-21 DIAGNOSIS — L0889 Other specified local infections of the skin and subcutaneous tissue: Secondary | ICD-10-CM | POA: Diagnosis not present

## 2023-01-21 DIAGNOSIS — L738 Other specified follicular disorders: Secondary | ICD-10-CM | POA: Diagnosis not present

## 2023-01-21 DIAGNOSIS — Z85828 Personal history of other malignant neoplasm of skin: Secondary | ICD-10-CM | POA: Diagnosis not present

## 2023-01-21 DIAGNOSIS — L821 Other seborrheic keratosis: Secondary | ICD-10-CM | POA: Diagnosis not present

## 2023-02-04 DIAGNOSIS — R351 Nocturia: Secondary | ICD-10-CM | POA: Diagnosis not present

## 2023-02-04 DIAGNOSIS — Z6835 Body mass index (BMI) 35.0-35.9, adult: Secondary | ICD-10-CM | POA: Diagnosis not present

## 2023-02-04 DIAGNOSIS — Z872 Personal history of diseases of the skin and subcutaneous tissue: Secondary | ICD-10-CM | POA: Diagnosis not present

## 2023-02-04 DIAGNOSIS — N401 Enlarged prostate with lower urinary tract symptoms: Secondary | ICD-10-CM | POA: Diagnosis not present

## 2023-02-06 ENCOUNTER — Ambulatory Visit: Payer: Medicare Other | Admitting: Cardiovascular Disease

## 2023-03-05 DIAGNOSIS — G245 Blepharospasm: Secondary | ICD-10-CM | POA: Diagnosis not present

## 2023-03-20 ENCOUNTER — Encounter: Payer: Self-pay | Admitting: Urology

## 2023-03-20 ENCOUNTER — Ambulatory Visit (INDEPENDENT_AMBULATORY_CARE_PROVIDER_SITE_OTHER): Payer: Medicare Other | Admitting: Urology

## 2023-03-20 VITALS — BP 132/68 | HR 72 | Ht 70.0 in | Wt 252.0 lb

## 2023-03-20 DIAGNOSIS — N401 Enlarged prostate with lower urinary tract symptoms: Secondary | ICD-10-CM | POA: Diagnosis not present

## 2023-03-20 NOTE — Progress Notes (Signed)
I, John Novak, acting as a scribe for John Altes, MD., have documented all relevant documentation on the behalf of John Altes, MD, as directed by John Altes, MD while in the presence of John Altes, MD.  03/20/2023 3:32 PM   [name] 02/15/1946 [id number]  Referring provider: No referring provider defined for this encounter.  Chief Complaint  Patient presents with   Other    HPI: John Novak is a 77 y.o. male referred for prostate check/established care.   He states his PCP has checked his PSA regularly, which has been normal, however he states he has not had a prostate exam in 20 years and wanted to have this checked.  History of BPH on tamsulosin 0.4 mg twice daily. He had noted urinary frequency and voiding small amounts. He has improved his hydration and is voiding greater volumes. He states his urinary frequency is not bothersome. He does have post-void dribbling after urination and wears a pad.  Denies dysuria or gross hematuria.  No flank, abdominal, or pelvic pain.   PMH: Past Medical History:  Diagnosis Date   Anxiety    Arthritis    knees   Blepharospasm    bilateral, hx. "Meige syndrome" nerve issue- follow -up [facility] with Botox injections   Coronary atherosclerosis    Diverticulosis    no problems   Echocardiogram    Echo 11/19: EF 60-65, no RWMA, Gr 1 DD   Gout    Hyperlipidemia    Hypertension    Sleep apnea    automatic    Surgical History: Past Surgical History:  Procedure Laterality Date   BACK SURGERY     lower back   BROW LIFT AND BLEPHAROPLASTY Bilateral    CARDIAC CATHETERIZATION     negative for significant findings   INSERTION OF MESH N/A 06/23/2014   Procedure: INSERTION OF MESH;  Surgeon: Karie Soda, MD;  Location: [facility];  Service: General;  Laterality: N/A;   KNEE ARTHROSCOPY Bilateral    bilateral   SHOULDER ARTHROSCOPY W/ ROTATOR CUFF REPAIR Left    VENTRAL HERNIA REPAIR N/A 06/23/2014    Procedure: LAPAROSCOPIC VENTRAL WALL HERNIA REPAIR;  Surgeon: Karie Soda, MD;  Location: [facility];  Service: General;  Laterality: N/A;    Home Medications:  Allergies as of 03/20/2023       Reactions   Other    Neoprine rubber -- rash   Hydrocodone Rash   Latex Rash   Contact if pt wears   Penicillins Rash        Medication List        Accurate as of March 20, 2023  3:32 PM. If you have any questions, ask your nurse or doctor.          STOP taking these medications    Azelastine HCl 0.15 % Soln Stopped by: John Novak   fexofenadine 60 MG tablet Commonly known as: ALLEGRA Stopped by: John Novak       TAKE these medications    albuterol 108 (90 Base) MCG/ACT inhaler Commonly known as: VENTOLIN HFA SMARTSIG:2 Puff(s) By Mouth Every 4-6 Hours PRN   allopurinol 100 MG tablet Commonly known as: ZYLOPRIM Take 200 mg by mouth daily.   aspirin 325 MG tablet Take 325 mg by mouth daily.   Bepotastine Besilate 1.5 % Soln Place 1 drop into both eyes daily.   BOTOX IJ Inject 100-200 Units as directed. Every 11 weeks patient  gets 100 units around eyes. Every 22 weeks pt gets 200 units in face and neck  At [facility]   celecoxib 200 MG capsule Commonly known as: CELEBREX Take 200 mg by mouth daily as needed for mild pain.   clonazePAM 0.5 MG tablet Commonly known as: KLONOPIN Take 0.5 mg by mouth 2 (two) times daily as needed for anxiety.   dexlansoprazole 60 MG capsule Commonly known as: DEXILANT Take 60 mg by mouth daily.   diclofenac sodium 1 % Gel Commonly known as: VOLTAREN Apply 1 application topically daily as needed (for pain).   fluticasone 50 MCG/ACT nasal spray Commonly known as: FLONASE Place 1 spray into both nostrils 2 (two) times daily.   influenza vac split quadrivalent PF 0.5 ML injection Commonly known as: FLUARIX Inject 0.5 mLs into the muscle once.   ipratropium 0.03 % nasal spray Commonly known as:  ATROVENT Place 2 sprays into both nostrils as needed.   lisinopril 40 MG tablet Commonly known as: ZESTRIL Take 60 mg by mouth every morning.   LORazepam 0.5 MG tablet Commonly known as: ATIVAN Take 0.5-1 mg by mouth 3 (three) times daily. Takes 1mg  in the morning, 0.5mg  at lunch, and 0.5mg  in the evening   MUCINEX PO Take 1 capsule by mouth daily.   Olopatadine HCl 0.6 % Soln Place 2 puffs into both nostrils 2 (two) times daily.   Omega-3 1400 MG Caps 2 capsules   OVER THE COUNTER MEDICATION Take 1 tablet by mouth 2 (two) times daily. lipitropic Vitamin   OVER THE COUNTER MEDICATION Take 1 tablet by mouth 3 (three) times daily. Gout cure   Probiotic Daily Caps Take 1 capsule by mouth daily with breakfast.   Propylene Glycol 0.6 % Soln Place 1 drop into both eyes every 4 (four) hours as needed (for dry eyes).   PX Complete Senior Multivits Tabs Take 1 tablet by mouth daily.   simvastatin 80 MG tablet Commonly known as: ZOCOR Take 80 mg by mouth at bedtime.   tamsulosin 0.4 MG Caps capsule Commonly known as: FLOMAX Take 0.4 mg by mouth daily after supper.   zolpidem 10 MG tablet Commonly known as: AMBIEN Take 10 mg by mouth at bedtime as needed for sleep.        Allergies:  Allergies  Allergen Reactions   Other     Neoprine rubber -- rash   Hydrocodone Rash   Latex Rash    Contact if pt wears   Penicillins Rash    Family History: Family History  Family history unknown: Yes    Social History:  reports that he has never smoked. He has never used smokeless tobacco. He reports that he does not drink alcohol and does not use drugs.   Physical Exam: BP 132/68   Pulse 72   Ht 5\' 10"  (1.778 m)   Wt 252 lb (114.3 kg)   BMI 36.16 kg/m   Constitutional:  Alert and oriented, No acute distress. HEENT: [place] AT, moist mucus membranes.  Trachea midline, no masses. Cardiovascular: No clubbing, cyanosis, or edema. Respiratory: Normal respiratory effort, no  increased work of breathing. GI: Abdomen is soft, nontender, nondistended, no abdominal masses GU: Prostate 40 grams smooth without nodules.  Skin: No rashes, bruises or suspicious lesions. Neurologic: Grossly intact, no focal deficits, moving all 4 extremities. Psychiatric: Normal mood and affect.   Assessment & Plan:    1. BPH with LUTS  He states his voiding pattern is not bothersome and  declines additional medication or further management and states he is currently satisfied with his voiding pattern.   2. Prostate cancer screening We discuss current prostate cancer screening recommendations include a PSA and DRE from ages 52-69. We discuss prostate cancer screening is no longer recommended after that age if his findings are stable. He will follow up as needed.  [facility] [address], [address] [place], [place] [address] [phone]

## 2023-03-21 ENCOUNTER — Encounter: Payer: Self-pay | Admitting: Urology

## 2023-04-07 DIAGNOSIS — Z85828 Personal history of other malignant neoplasm of skin: Secondary | ICD-10-CM | POA: Diagnosis not present

## 2023-04-07 DIAGNOSIS — D225 Melanocytic nevi of trunk: Secondary | ICD-10-CM | POA: Diagnosis not present

## 2023-04-07 DIAGNOSIS — L821 Other seborrheic keratosis: Secondary | ICD-10-CM | POA: Diagnosis not present

## 2023-04-07 DIAGNOSIS — D2262 Melanocytic nevi of left upper limb, including shoulder: Secondary | ICD-10-CM | POA: Diagnosis not present

## 2023-04-07 DIAGNOSIS — D485 Neoplasm of uncertain behavior of skin: Secondary | ICD-10-CM | POA: Diagnosis not present

## 2023-04-07 DIAGNOSIS — L905 Scar conditions and fibrosis of skin: Secondary | ICD-10-CM | POA: Diagnosis not present

## 2023-04-07 DIAGNOSIS — D2261 Melanocytic nevi of right upper limb, including shoulder: Secondary | ICD-10-CM | POA: Diagnosis not present

## 2023-04-07 DIAGNOSIS — D045 Carcinoma in situ of skin of trunk: Secondary | ICD-10-CM | POA: Diagnosis not present

## 2023-04-15 DIAGNOSIS — Z23 Encounter for immunization: Secondary | ICD-10-CM | POA: Diagnosis not present

## 2023-05-04 DIAGNOSIS — Z23 Encounter for immunization: Secondary | ICD-10-CM | POA: Diagnosis not present

## 2023-05-05 ENCOUNTER — Encounter: Payer: Self-pay | Admitting: Cardiovascular Disease

## 2023-05-05 NOTE — Progress Notes (Unsigned)
  Cardiology Office Note:  .   Date:  05/06/2023  ID:  John Novak, DOB [birth year], MRN [id number] PCP: Gweneth Dimitri, MD  [facility] Providers Cardiologist:  Lesleigh Noe, MD (Inactive)    History of Present Illness: .    Oct. 30, 2024  John Novak is a 77 y.o. male pt of Dr.Smith I am seeing him for the first time today   Hx of nonobstructive CAD by cath in 2009, HTN, HLD , OSA gout  Had an ER visit in Feb. 2023 which revealed LBBB   Is a potter,    Has bad knees so he does not walk much   No CP, no dyspnea  ROS:   Studies Reviewed: [name]   EKG Interpretation Date/Time:  Wednesday May 06 2023 11:16:03 EDT Ventricular Rate:  61 PR Interval:  214 QRS Duration:  150 QT Interval:  438 QTC Calculation: 440 R Axis:   113  Text Interpretation: Sinus rhythm with sinus arrhythmia with 1st degree A-V block Right bundle branch block Left posterior fascicular block Bifascicular block Possible Inferior infarct (cited on or before 01-Sep-2021) Cannot rule out Anterior infarct (cited on or before 01-Sep-2021) When compared with ECG of 01-Sep-2021 21:21, Premature supraventricular complexes are no longer Present PR interval has increased Questionable change in initial forces of Anterior leads T wave inversion no longer evident in Inferior leads Confirmed by Kristeen Miss ([id number]) on 05/06/2023 11:19:22 AM    Risk Assessment/Calculations:             Physical Exam:   VS:  BP 138/86   Pulse 62   Ht 5\' 10"  (1.778 m)   Wt 258 lb (117 kg)   SpO2 93%   BMI 37.02 kg/m    Wt Readings from Last 3 Encounters:  05/06/23 258 lb (117 kg)  03/20/23 252 lb (114.3 kg)  09/04/21 228 lb (103.4 kg)    GEN: Well nourished, well developed in no acute distress NECK: No JVD; No carotid bruits CARDIAC: RRR, no murmurs, rubs, gallops RESPIRATORY:  Clear to auscultation without rales, wheezing or rhonchi  ABDOMEN: Soft, non-tender, non-distended EXTREMITIES:  No edema; No  deformity   ASSESSMENT AND PLAN: .    HTN:   advised salt restriction   2.  RBBB :  3.  HLD :  is on simvastatin 80, change him to atorvastatin 40 mg a day.  Check lipids, ALT, basic metabolic profile in 3 months.       Dispo: 1 year   Signed, Kristeen Miss, MD

## 2023-05-06 ENCOUNTER — Ambulatory Visit: Payer: Medicare Other | Attending: Cardiovascular Disease | Admitting: Cardiovascular Disease

## 2023-05-06 ENCOUNTER — Encounter: Payer: Self-pay | Admitting: Cardiovascular Disease

## 2023-05-06 VITALS — BP 138/86 | HR 62 | Ht 70.0 in | Wt 258.0 lb

## 2023-05-06 DIAGNOSIS — Z79899 Other long term (current) drug therapy: Secondary | ICD-10-CM | POA: Diagnosis not present

## 2023-05-06 DIAGNOSIS — I4510 Unspecified right bundle-branch block: Secondary | ICD-10-CM

## 2023-05-06 DIAGNOSIS — I2510 Atherosclerotic heart disease of native coronary artery without angina pectoris: Secondary | ICD-10-CM | POA: Diagnosis not present

## 2023-05-06 DIAGNOSIS — E782 Mixed hyperlipidemia: Secondary | ICD-10-CM

## 2023-05-06 MED ORDER — ATORVASTATIN CALCIUM 40 MG PO TABS: 40.0000 mg | ORAL_TABLET | Freq: Every day | ORAL | 3 refills | Status: AC

## 2023-05-06 NOTE — Patient Instructions (Signed)
Medication Instructions:  STOP Simvastatin START Atorvastatin 40mg  daily *If you need a refill on your cardiac medications before your next appointment, please call your pharmacy*  Lab Work: BMET, Lipids, ALT in 3 months If you have labs (blood work) drawn today and your tests are completely normal, you will receive your results only by: MyChart Message (if you have MyChart) OR A paper copy in the mail If you have any lab test that is abnormal or we need to change your treatment, we will call you to review the results.  Follow-Up: At [facility], you and your health needs are our priority.  As part of our continuing mission to provide you with exceptional heart care, we have created designated Provider Care Teams.  These Care Teams include your primary Cardiologist (physician) and Advanced Practice Providers (APPs -  Physician Assistants and Nurse Practitioners) who all work together to provide you with the care you need, when you need it.  Your next appointment:   1 year(s)  Provider:   Kristeen Miss, MD

## 2023-05-19 DIAGNOSIS — G244 Idiopathic orofacial dystonia: Secondary | ICD-10-CM | POA: Diagnosis not present

## 2023-05-22 DIAGNOSIS — G245 Blepharospasm: Secondary | ICD-10-CM | POA: Diagnosis not present

## 2023-06-11 DIAGNOSIS — I10 Essential (primary) hypertension: Secondary | ICD-10-CM | POA: Diagnosis not present

## 2023-06-11 DIAGNOSIS — R7303 Prediabetes: Secondary | ICD-10-CM | POA: Diagnosis not present

## 2023-06-11 DIAGNOSIS — E782 Mixed hyperlipidemia: Secondary | ICD-10-CM | POA: Diagnosis not present

## 2023-06-11 DIAGNOSIS — Z79899 Other long term (current) drug therapy: Secondary | ICD-10-CM | POA: Diagnosis not present

## 2023-06-11 DIAGNOSIS — M109 Gout, unspecified: Secondary | ICD-10-CM | POA: Diagnosis not present

## 2023-06-17 DIAGNOSIS — I10 Essential (primary) hypertension: Secondary | ICD-10-CM | POA: Diagnosis not present

## 2023-06-17 DIAGNOSIS — M25552 Pain in left hip: Secondary | ICD-10-CM | POA: Diagnosis not present

## 2023-06-17 DIAGNOSIS — N400 Enlarged prostate without lower urinary tract symptoms: Secondary | ICD-10-CM | POA: Diagnosis not present

## 2023-06-17 DIAGNOSIS — M109 Gout, unspecified: Secondary | ICD-10-CM | POA: Diagnosis not present

## 2023-06-17 DIAGNOSIS — R7303 Prediabetes: Secondary | ICD-10-CM | POA: Diagnosis not present

## 2023-06-17 DIAGNOSIS — E782 Mixed hyperlipidemia: Secondary | ICD-10-CM | POA: Diagnosis not present

## 2023-06-17 DIAGNOSIS — K219 Gastro-esophageal reflux disease without esophagitis: Secondary | ICD-10-CM | POA: Diagnosis not present

## 2023-06-17 DIAGNOSIS — M170 Bilateral primary osteoarthritis of knee: Secondary | ICD-10-CM | POA: Diagnosis not present

## 2023-06-17 DIAGNOSIS — G479 Sleep disorder, unspecified: Secondary | ICD-10-CM | POA: Diagnosis not present

## 2023-06-17 DIAGNOSIS — I4510 Unspecified right bundle-branch block: Secondary | ICD-10-CM | POA: Diagnosis not present

## 2023-06-17 DIAGNOSIS — G4733 Obstructive sleep apnea (adult) (pediatric): Secondary | ICD-10-CM | POA: Diagnosis not present

## 2023-07-09 DIAGNOSIS — L718 Other rosacea: Secondary | ICD-10-CM | POA: Diagnosis not present

## 2023-07-09 DIAGNOSIS — Z85828 Personal history of other malignant neoplasm of skin: Secondary | ICD-10-CM | POA: Diagnosis not present

## 2023-07-09 DIAGNOSIS — L821 Other seborrheic keratosis: Secondary | ICD-10-CM | POA: Diagnosis not present

## 2023-07-09 DIAGNOSIS — L820 Inflamed seborrheic keratosis: Secondary | ICD-10-CM | POA: Diagnosis not present

## 2023-07-15 DIAGNOSIS — H2511 Age-related nuclear cataract, right eye: Secondary | ICD-10-CM | POA: Diagnosis not present

## 2023-07-15 DIAGNOSIS — H04123 Dry eye syndrome of bilateral lacrimal glands: Secondary | ICD-10-CM | POA: Diagnosis not present

## 2023-07-15 DIAGNOSIS — G245 Blepharospasm: Secondary | ICD-10-CM | POA: Diagnosis not present

## 2023-07-15 DIAGNOSIS — H43813 Vitreous degeneration, bilateral: Secondary | ICD-10-CM | POA: Diagnosis not present

## 2023-07-15 DIAGNOSIS — H2512 Age-related nuclear cataract, left eye: Secondary | ICD-10-CM | POA: Diagnosis not present

## 2023-08-10 DIAGNOSIS — G245 Blepharospasm: Secondary | ICD-10-CM | POA: Diagnosis not present

## 2023-09-21 DIAGNOSIS — E782 Mixed hyperlipidemia: Secondary | ICD-10-CM | POA: Diagnosis not present

## 2023-09-22 DIAGNOSIS — G245 Blepharospasm: Secondary | ICD-10-CM | POA: Diagnosis not present

## 2023-09-22 DIAGNOSIS — G244 Idiopathic orofacial dystonia: Secondary | ICD-10-CM | POA: Diagnosis not present

## 2023-09-22 DIAGNOSIS — G243 Spasmodic torticollis: Secondary | ICD-10-CM | POA: Diagnosis not present

## 2023-10-22 DIAGNOSIS — G245 Blepharospasm: Secondary | ICD-10-CM | POA: Diagnosis not present

## 2023-10-29 DIAGNOSIS — G245 Blepharospasm: Secondary | ICD-10-CM | POA: Diagnosis not present

## 2023-11-25 DIAGNOSIS — M25661 Stiffness of right knee, not elsewhere classified: Secondary | ICD-10-CM | POA: Diagnosis not present

## 2023-11-25 DIAGNOSIS — R262 Difficulty in walking, not elsewhere classified: Secondary | ICD-10-CM | POA: Diagnosis not present

## 2023-11-25 DIAGNOSIS — M1711 Unilateral primary osteoarthritis, right knee: Secondary | ICD-10-CM | POA: Diagnosis not present

## 2023-11-25 DIAGNOSIS — M25561 Pain in right knee: Secondary | ICD-10-CM | POA: Diagnosis not present

## 2023-11-26 DIAGNOSIS — Z23 Encounter for immunization: Secondary | ICD-10-CM | POA: Diagnosis not present

## 2023-12-02 DIAGNOSIS — M25661 Stiffness of right knee, not elsewhere classified: Secondary | ICD-10-CM | POA: Diagnosis not present

## 2023-12-02 DIAGNOSIS — M1711 Unilateral primary osteoarthritis, right knee: Secondary | ICD-10-CM | POA: Diagnosis not present

## 2023-12-02 DIAGNOSIS — M25561 Pain in right knee: Secondary | ICD-10-CM | POA: Diagnosis not present

## 2023-12-02 DIAGNOSIS — R262 Difficulty in walking, not elsewhere classified: Secondary | ICD-10-CM | POA: Diagnosis not present

## 2023-12-09 DIAGNOSIS — M25561 Pain in right knee: Secondary | ICD-10-CM | POA: Diagnosis not present

## 2023-12-09 DIAGNOSIS — M1711 Unilateral primary osteoarthritis, right knee: Secondary | ICD-10-CM | POA: Diagnosis not present

## 2023-12-09 DIAGNOSIS — M25661 Stiffness of right knee, not elsewhere classified: Secondary | ICD-10-CM | POA: Diagnosis not present

## 2023-12-09 DIAGNOSIS — R262 Difficulty in walking, not elsewhere classified: Secondary | ICD-10-CM | POA: Diagnosis not present

## 2023-12-16 DIAGNOSIS — M1711 Unilateral primary osteoarthritis, right knee: Secondary | ICD-10-CM | POA: Diagnosis not present

## 2023-12-16 DIAGNOSIS — R262 Difficulty in walking, not elsewhere classified: Secondary | ICD-10-CM | POA: Diagnosis not present

## 2023-12-16 DIAGNOSIS — M25661 Stiffness of right knee, not elsewhere classified: Secondary | ICD-10-CM | POA: Diagnosis not present

## 2023-12-16 DIAGNOSIS — M25561 Pain in right knee: Secondary | ICD-10-CM | POA: Diagnosis not present

## 2023-12-22 DIAGNOSIS — G4733 Obstructive sleep apnea (adult) (pediatric): Secondary | ICD-10-CM | POA: Diagnosis not present

## 2023-12-23 DIAGNOSIS — M1711 Unilateral primary osteoarthritis, right knee: Secondary | ICD-10-CM | POA: Diagnosis not present

## 2023-12-23 DIAGNOSIS — R262 Difficulty in walking, not elsewhere classified: Secondary | ICD-10-CM | POA: Diagnosis not present

## 2023-12-23 DIAGNOSIS — M25661 Stiffness of right knee, not elsewhere classified: Secondary | ICD-10-CM | POA: Diagnosis not present

## 2023-12-23 DIAGNOSIS — M25561 Pain in right knee: Secondary | ICD-10-CM | POA: Diagnosis not present

## 2023-12-30 DIAGNOSIS — M109 Gout, unspecified: Secondary | ICD-10-CM | POA: Diagnosis not present

## 2023-12-30 DIAGNOSIS — E782 Mixed hyperlipidemia: Secondary | ICD-10-CM | POA: Diagnosis not present

## 2023-12-30 DIAGNOSIS — G244 Idiopathic orofacial dystonia: Secondary | ICD-10-CM | POA: Diagnosis not present

## 2023-12-30 DIAGNOSIS — Z0000 Encounter for general adult medical examination without abnormal findings: Secondary | ICD-10-CM | POA: Diagnosis not present

## 2023-12-30 DIAGNOSIS — G4733 Obstructive sleep apnea (adult) (pediatric): Secondary | ICD-10-CM | POA: Diagnosis not present

## 2023-12-30 DIAGNOSIS — R7303 Prediabetes: Secondary | ICD-10-CM | POA: Diagnosis not present

## 2023-12-30 DIAGNOSIS — G479 Sleep disorder, unspecified: Secondary | ICD-10-CM | POA: Diagnosis not present

## 2023-12-30 DIAGNOSIS — K5909 Other constipation: Secondary | ICD-10-CM | POA: Diagnosis not present

## 2023-12-30 DIAGNOSIS — I10 Essential (primary) hypertension: Secondary | ICD-10-CM | POA: Diagnosis not present

## 2023-12-30 DIAGNOSIS — Z6836 Body mass index (BMI) 36.0-36.9, adult: Secondary | ICD-10-CM | POA: Diagnosis not present

## 2023-12-30 DIAGNOSIS — M79671 Pain in right foot: Secondary | ICD-10-CM | POA: Diagnosis not present

## 2024-01-06 DIAGNOSIS — M25561 Pain in right knee: Secondary | ICD-10-CM | POA: Diagnosis not present

## 2024-01-06 DIAGNOSIS — R262 Difficulty in walking, not elsewhere classified: Secondary | ICD-10-CM | POA: Diagnosis not present

## 2024-01-06 DIAGNOSIS — M1711 Unilateral primary osteoarthritis, right knee: Secondary | ICD-10-CM | POA: Diagnosis not present

## 2024-01-06 DIAGNOSIS — M25661 Stiffness of right knee, not elsewhere classified: Secondary | ICD-10-CM | POA: Diagnosis not present

## 2024-01-21 DIAGNOSIS — G245 Blepharospasm: Secondary | ICD-10-CM | POA: Diagnosis not present

## 2024-02-16 DIAGNOSIS — G243 Spasmodic torticollis: Secondary | ICD-10-CM | POA: Diagnosis not present

## 2024-02-16 DIAGNOSIS — G244 Idiopathic orofacial dystonia: Secondary | ICD-10-CM | POA: Diagnosis not present

## 2024-02-16 DIAGNOSIS — G245 Blepharospasm: Secondary | ICD-10-CM | POA: Diagnosis not present

## 2024-03-18 DIAGNOSIS — Z23 Encounter for immunization: Secondary | ICD-10-CM | POA: Diagnosis not present

## 2024-04-01 DIAGNOSIS — Z23 Encounter for immunization: Secondary | ICD-10-CM | POA: Diagnosis not present

## 2024-04-07 DIAGNOSIS — D225 Melanocytic nevi of trunk: Secondary | ICD-10-CM | POA: Diagnosis not present

## 2024-04-07 DIAGNOSIS — L821 Other seborrheic keratosis: Secondary | ICD-10-CM | POA: Diagnosis not present

## 2024-04-07 DIAGNOSIS — D1801 Hemangioma of skin and subcutaneous tissue: Secondary | ICD-10-CM | POA: Diagnosis not present

## 2024-04-07 DIAGNOSIS — Z85828 Personal history of other malignant neoplasm of skin: Secondary | ICD-10-CM | POA: Diagnosis not present

## 2024-04-26 DIAGNOSIS — M545 Low back pain, unspecified: Secondary | ICD-10-CM | POA: Diagnosis not present

## 2024-04-28 DIAGNOSIS — G245 Blepharospasm: Secondary | ICD-10-CM | POA: Diagnosis not present

## 2024-05-09 ENCOUNTER — Ambulatory Visit: Attending: Cardiology | Admitting: Cardiology

## 2024-05-09 ENCOUNTER — Encounter: Payer: Self-pay | Admitting: Cardiology

## 2024-05-09 VITALS — BP 144/78 | HR 61 | Ht 70.0 in | Wt 256.2 lb

## 2024-05-09 DIAGNOSIS — I10 Essential (primary) hypertension: Secondary | ICD-10-CM | POA: Diagnosis not present

## 2024-05-09 DIAGNOSIS — I2510 Atherosclerotic heart disease of native coronary artery without angina pectoris: Secondary | ICD-10-CM | POA: Diagnosis not present

## 2024-05-09 DIAGNOSIS — R600 Localized edema: Secondary | ICD-10-CM | POA: Diagnosis not present

## 2024-05-09 MED ORDER — FUROSEMIDE 20 MG PO TABS: 20.0000 mg | ORAL_TABLET | Freq: Every day | ORAL | 3 refills | Status: AC

## 2024-05-09 MED ORDER — ASPIRIN 81 MG PO TBEC: 81.0000 mg | DELAYED_RELEASE_TABLET | Freq: Every day | ORAL | 3 refills | Status: AC

## 2024-05-09 NOTE — Patient Instructions (Addendum)
 Medication Instructions:  DECREASE Aspirin  to 81 mg daily   START Lasix 20 mg daily  *If you need a refill on your cardiac medications before your next appointment, please call your pharmacy*  Lab Work: Cmp- today Probnp- today  BMP- in 2 weeks   If you have labs (blood work) drawn today and your tests are completely normal, you will receive your results only by: MyChart Message (if you have MyChart) OR A paper copy in the mail If you have any lab test that is abnormal or we need to change your treatment, we will call you to review the results.  Testing/Procedures: Echocardiogram  Your physician has requested that you have an echocardiogram. Echocardiography is a painless test that uses sound waves to create images of your heart. It provides your doctor with information about the size and shape of your heart and how well your heart's chambers and valves are working. This procedure takes approximately one hour. There are no restrictions for this procedure. Please do NOT wear cologne, perfume, aftershave, or lotions (deodorant is allowed). Please arrive 15 minutes prior to your appointment time.  Please note: We ask at that you not bring children with you during ultrasound (echo/ vascular) testing. Due to room size and safety concerns, children are not allowed in the ultrasound rooms during exams. Our front office staff cannot provide observation of children in our lobby area while testing is being conducted. An adult accompanying a patient to their appointment will only be allowed in the ultrasound room at the discretion of the ultrasound technician under special circumstances. We apologize for any inconvenience.   Follow-Up: At [facility], you and your health needs are our priority.  As part of our continuing mission to provide you with exceptional heart care, our providers are all part of one team.  This team includes your primary Cardiologist (physician) and Advanced  Practice Providers or APPs (Physician Assistants and Nurse Practitioners) who all work together to provide you with the care you need, when you need it.  Your next appointment:   4-6 week(s)  Provider:   Newman JINNY Lawrence, MD

## 2024-05-09 NOTE — Progress Notes (Signed)
 Cardiology Office Note:  .   Date:  05/09/2024  ID:  John Novak, DOB June 28, 1946, MRN [id number] PCP: Aisha Harvey, MD  [facility] Providers Cardiologist:  Newman Lawrence, MD PCP: Aisha Harvey, MD  Chief Complaint  Patient presents with   Hypertension   RBBB     John Novak is a 78 y.o. male with hypertension, hyperlipidemia, nonobstructive CAD  Discussed the use of AI scribe software for clinical note transcription with the patient, who gave verbal consent to proceed.  History of Present Illness John Novak is a 78 year old male with hypertension who presents with leg swelling.  Leg swelling has been present for eight months. There is no chest pain, but he uses a CPAP machine and experiences occasional shortness of breath. He consumes salty food.  He takes lisinopril 60 mg daily, increased from 40 mg six months ago, with blood pressure around 140/75 mmHg. He takes aspirin  325 mg every other day for prevention.      Vitals:   05/09/24 1108  BP: (!) 144/78  Pulse: 61  SpO2: 93%      Review of Systems  Cardiovascular:  Positive for leg swelling. Negative for chest pain, dyspnea on exertion, palpitations and syncope.        Studies Reviewed: SABRA        EKG 05/09/2024: Normal sinus rhythm with sinus arrhythmia Non-specific intra-ventricular conduction block Possible Right ventricular hypertrophy Cannot rule out Inferior infarct (cited on or before 01-Sep-2021) When compared with ECG of 06-May-2023 11:16, Minimal criteria for Anterior infarct are no longer Present  Labs 09/2023: Chol 107, TG 104, HDL 38, LDL 50 HbA1C 6.0% Hb 13.7  2023: Cr 1.25   Physical Exam Vitals and nursing note reviewed.  Constitutional:      General: He is not in acute distress. Neck:     Vascular: No JVD.  Cardiovascular:     Rate and Rhythm: Normal rate and regular rhythm.     Heart sounds: Normal heart sounds. No murmur heard. Pulmonary:      Effort: Pulmonary effort is normal.     Breath sounds: Normal breath sounds. No wheezing or rales.  Musculoskeletal:     Right lower leg: Edema (1+) present.     Left lower leg: Edema (1+) present.      VISIT DIAGNOSES:   ICD-10-CM   1. Primary hypertension  I10 EKG 12-Lead    Comprehensive metabolic panel with GFR    Basic metabolic panel with GFR    Basic metabolic panel with GFR    2. Coronary artery disease involving native coronary artery of native heart without angina pectoris  I25.10 EKG 12-Lead    3. Leg edema  R60.0 Comprehensive metabolic panel with GFR    Pro b natriuretic peptide (BNP)    Basic metabolic panel with GFR    ECHOCARDIOGRAM COMPLETE    Basic metabolic panel with GFR       John Novak is a 78 y.o. male with hypertension, hyperlipidemia, nonobstructive CAD Assessment & Plan Lower extremity edema: Chronic edema likely due to fluid retention. Differential includes salt intake, venous insufficiency, or heart failure. - Check BMP, proBNP - Started Lasix 20 mg for fluid management. - Ordered echocardiogram for cardiac evaluation. - Advised salt intake reduction.  Primary hypertension: Blood pressure elevated at 140/75 mmHg. Diuretic therapy may aid control. - Continue lisinopril daily. - Started Lasix 20 mg for blood pressure control.  CAD: Mild nonobstructive disease noted in  2009, no subsequent ischemia evaluation. No ischemia symptoms at this time. Reasonable to take aspirin  daily, but does not detail a 25 mg.  Okay to take 81 mg daily. Lipids well-controlled, continue Lipitor 40 mg daily        Meds ordered this encounter  Medications   aspirin  EC 81 MG tablet    Sig: Take 1 tablet (81 mg total) by mouth daily. Swallow whole.    Dispense:  90 tablet    Refill:  3   furosemide (LASIX) 20 MG tablet    Sig: Take 1 tablet (20 mg total) by mouth daily.    Dispense:  90 tablet    Refill:  3     F/u in 4-6 weeks  Signed, Newman JINNY Lawrence, MD

## 2024-05-10 ENCOUNTER — Ambulatory Visit: Payer: Self-pay | Admitting: Cardiology

## 2024-05-10 DIAGNOSIS — M545 Low back pain, unspecified: Secondary | ICD-10-CM | POA: Diagnosis not present

## 2024-05-10 DIAGNOSIS — M25552 Pain in left hip: Secondary | ICD-10-CM | POA: Diagnosis not present

## 2024-05-10 DIAGNOSIS — T148XXA Other injury of unspecified body region, initial encounter: Secondary | ICD-10-CM | POA: Diagnosis not present

## 2024-05-10 LAB — COMPREHENSIVE METABOLIC PANEL WITH GFR
ALT: 14 IU/L (ref 0–44)
AST: 17 IU/L (ref 0–40)
Albumin: 4.2 g/dL (ref 3.8–4.8)
Alkaline Phosphatase: 109 IU/L (ref 47–123)
BUN/Creatinine Ratio: 15 (ref 10–24)
BUN: 17 mg/dL (ref 8–27)
Bilirubin Total: 0.6 mg/dL (ref 0.0–1.2)
CO2: 25 mmol/L (ref 20–29)
Calcium: 9.5 mg/dL (ref 8.6–10.2)
Chloride: 105 mmol/L (ref 96–106)
Creatinine, Ser: 1.14 mg/dL (ref 0.76–1.27)
Globulin, Total: 2 g/dL (ref 1.5–4.5)
Glucose: 88 mg/dL (ref 70–99)
Potassium: 4.5 mmol/L (ref 3.5–5.2)
Sodium: 142 mmol/L (ref 134–144)
Total Protein: 6.2 g/dL (ref 6.0–8.5)
eGFR: 66 mL/min/1.73 (ref >59)

## 2024-05-10 LAB — PRO B NATRIURETIC PEPTIDE: NT-Pro BNP: 72 pg/mL (ref 0–486)

## 2024-05-23 DIAGNOSIS — I10 Essential (primary) hypertension: Secondary | ICD-10-CM | POA: Diagnosis not present

## 2024-05-23 DIAGNOSIS — R600 Localized edema: Secondary | ICD-10-CM | POA: Diagnosis not present

## 2024-05-24 LAB — BASIC METABOLIC PANEL WITH GFR
BUN/Creatinine Ratio: 14 (ref 10–24)
BUN: 15 mg/dL (ref 8–27)
CO2: 26 mmol/L (ref 20–29)
Calcium: 10.1 mg/dL (ref 8.6–10.2)
Chloride: 103 mmol/L (ref 96–106)
Creatinine, Ser: 1.09 mg/dL (ref 0.76–1.27)
Glucose: 97 mg/dL (ref 70–99)
Potassium: 4.7 mmol/L (ref 3.5–5.2)
Sodium: 142 mmol/L (ref 134–144)
eGFR: 69 mL/min/1.73 (ref >59)

## 2024-06-01 ENCOUNTER — Ambulatory Visit (HOSPITAL_COMMUNITY)
Admission: RE | Admit: 2024-06-01 | Discharge: 2024-06-01 | Disposition: A | Discharge status: Home / Self Care | Source: Ambulatory Visit | Attending: Cardiology | Admitting: Cardiology

## 2024-06-01 DIAGNOSIS — R600 Localized edema: Secondary | ICD-10-CM | POA: Insufficient documentation

## 2024-06-01 LAB — ECHOCARDIOGRAM COMPLETE
Area-P 1/2: 2.96 cm2
Calc EF: 47.7 %
S' Lateral: 2.5 cm
Single Plane A2C EF: 48.8 %
Single Plane A4C EF: 48 %

## 2024-06-01 MED ORDER — PERFLUTREN LIPID MICROSPHERE
1.0000 mL | INTRAVENOUS | Status: AC | PRN
  Administered 2024-06-01: 3 mL via INTRAVENOUS

## 2024-06-09 ENCOUNTER — Ambulatory Visit: Admitting: Cardiology

## 2024-06-16 DIAGNOSIS — Z79899 Other long term (current) drug therapy: Secondary | ICD-10-CM | POA: Diagnosis not present

## 2024-06-16 DIAGNOSIS — R7303 Prediabetes: Secondary | ICD-10-CM | POA: Diagnosis not present

## 2024-06-16 DIAGNOSIS — E782 Mixed hyperlipidemia: Secondary | ICD-10-CM | POA: Diagnosis not present

## 2024-06-21 DIAGNOSIS — H6642 Suppurative otitis media, unspecified, left ear: Secondary | ICD-10-CM | POA: Diagnosis not present

## 2024-06-21 DIAGNOSIS — M109 Gout, unspecified: Secondary | ICD-10-CM | POA: Diagnosis not present

## 2024-06-21 DIAGNOSIS — I2510 Atherosclerotic heart disease of native coronary artery without angina pectoris: Secondary | ICD-10-CM | POA: Diagnosis not present

## 2024-06-21 DIAGNOSIS — G479 Sleep disorder, unspecified: Secondary | ICD-10-CM | POA: Diagnosis not present

## 2024-06-21 DIAGNOSIS — G244 Idiopathic orofacial dystonia: Secondary | ICD-10-CM | POA: Diagnosis not present

## 2024-06-21 DIAGNOSIS — K5909 Other constipation: Secondary | ICD-10-CM | POA: Diagnosis not present

## 2024-06-21 DIAGNOSIS — M170 Bilateral primary osteoarthritis of knee: Secondary | ICD-10-CM | POA: Diagnosis not present

## 2024-06-21 DIAGNOSIS — G4733 Obstructive sleep apnea (adult) (pediatric): Secondary | ICD-10-CM | POA: Diagnosis not present

## 2024-06-21 DIAGNOSIS — K219 Gastro-esophageal reflux disease without esophagitis: Secondary | ICD-10-CM | POA: Diagnosis not present

## 2024-06-21 DIAGNOSIS — E782 Mixed hyperlipidemia: Secondary | ICD-10-CM | POA: Diagnosis not present

## 2024-06-21 DIAGNOSIS — N400 Enlarged prostate without lower urinary tract symptoms: Secondary | ICD-10-CM | POA: Diagnosis not present

## 2024-06-21 DIAGNOSIS — I10 Essential (primary) hypertension: Secondary | ICD-10-CM | POA: Diagnosis not present

## 2024-07-06 ENCOUNTER — Ambulatory Visit: Payer: Self-pay

## 2024-07-06 NOTE — Telephone Encounter (Signed)
" °  Advised would need to walk in UC.    Copied from CRM [id number]. Topic: Clinical - Red Word Triage >> Jul 06, 2024 11:00 AM Tiffini S wrote: [facility] that prompted transfer to Nurse Triage: elevated high blood pressure/ keeps increasing/ pain in ear- last night was dizzy and unable to walk without help/ assistance Reason for Disposition  Caller requesting an appointment, triage offered and declined  Answer Assessment - Initial Assessment Questions 1. REASON FOR CALL or QUESTION: What is your reason for calling today? or How can I best     Requesting appt for UC f/u earache. Isn't seen at primary care offices.  2. CALLER: Document the source of call. (e.g., laboratory staff, caregiver or patient).     patient  Protocols used: PCP Call - No Triage-A-AH  "

## 2024-08-08 ENCOUNTER — Ambulatory Visit: Admitting: Cardiology

## 2024-08-31 ENCOUNTER — Ambulatory Visit: Admitting: Cardiology
